# Patient Record
Sex: Male | Born: 1985
Health system: Southern US, Community
[De-identification: ages and names within clinical notes are randomized; demographics above are authoritative.]

## PROBLEM LIST (undated history)

## (undated) DIAGNOSIS — F418 Other specified anxiety disorders: Secondary | ICD-10-CM

## (undated) DIAGNOSIS — M25552 Pain in left hip: Secondary | ICD-10-CM

## (undated) DIAGNOSIS — F909 Attention-deficit hyperactivity disorder, unspecified type: Secondary | ICD-10-CM

## (undated) DIAGNOSIS — K219 Gastro-esophageal reflux disease without esophagitis: Secondary | ICD-10-CM

## (undated) DIAGNOSIS — F410 Panic disorder [episodic paroxysmal anxiety] without agoraphobia: Secondary | ICD-10-CM

## (undated) DIAGNOSIS — F32A Depression, unspecified: Secondary | ICD-10-CM

## (undated) DIAGNOSIS — F329 Major depressive disorder, single episode, unspecified: Secondary | ICD-10-CM

## (undated) HISTORY — DX: Pain in left hip: M25.552

## (undated) HISTORY — DX: Other specified anxiety disorders: F41.8

## (undated) HISTORY — DX: Major depressive disorder, single episode, unspecified: F32.9

## (undated) HISTORY — DX: Gastro-esophageal reflux disease without esophagitis: K21.9

## (undated) HISTORY — DX: Depression, unspecified: F32.A

## (undated) HISTORY — DX: Panic disorder (episodic paroxysmal anxiety): F41.0

## (undated) HISTORY — PX: WISDOM TOOTH EXTRACTION: SHX21

## (undated) HISTORY — DX: Attention-deficit hyperactivity disorder, unspecified type: F90.9

---

## 2006-06-06 ENCOUNTER — Ambulatory Visit (HOSPITAL_COMMUNITY): Admission: RE | Admit: 2006-06-06 | Discharge: 2006-06-06 | Payer: Self-pay | Admitting: Orthopaedic Surgery

## 2014-01-21 ENCOUNTER — Ambulatory Visit (INDEPENDENT_AMBULATORY_CARE_PROVIDER_SITE_OTHER): Payer: BC Managed Care – PPO | Admitting: Family Medicine

## 2014-01-21 ENCOUNTER — Encounter: Payer: Self-pay | Admitting: Family Medicine

## 2014-01-21 VITALS — BP 114/70 | HR 65 | Temp 99.0°F | Resp 18 | Ht 74.5 in | Wt 229.0 lb

## 2014-01-21 DIAGNOSIS — K219 Gastro-esophageal reflux disease without esophagitis: Secondary | ICD-10-CM | POA: Insufficient documentation

## 2014-01-21 DIAGNOSIS — F411 Generalized anxiety disorder: Secondary | ICD-10-CM | POA: Insufficient documentation

## 2014-01-21 DIAGNOSIS — F410 Panic disorder [episodic paroxysmal anxiety] without agoraphobia: Secondary | ICD-10-CM

## 2014-01-21 LAB — TSH: TSH: 1.41 u[IU]/mL (ref 0.35–4.50)

## 2014-01-21 MED ORDER — PANTOPRAZOLE SODIUM 40 MG PO TBEC: 40.0000 mg | DELAYED_RELEASE_TABLET | Freq: Every day | ORAL | Status: DC

## 2014-01-21 MED ORDER — LORAZEPAM 1 MG PO TABS: ORAL_TABLET | ORAL | Status: DC

## 2014-01-21 MED ORDER — CITALOPRAM HYDROBROMIDE 20 MG PO TABS
20.0000 mg | ORAL_TABLET | Freq: Every day | ORAL | Status: DC
Start: 2014-01-21 — End: 2014-07-15

## 2014-01-21 NOTE — Progress Notes (Addendum)
Office Note 02/06/2014  CC:  Chief Complaint  Patient presents with  . Establish Care  . Heartburn    for about a month  . Anxiety    unsure but possibly with new job    HPI:  Antonio MarinerRobert C James is a 28 y.o. White male who is here to establish care and discuss heartburn and anxiety. Patient's most recent primary MD: Dr. Lavada MesiMichael Hilts. Old records were not reviewed prior to or during today's visit.  Onset 1 mo ago intermittent heartburn, gradually worsening, constant in the last week or so--substernal burning that has now turned into a dull type stabbing feeling in center of sternum, feels like lump in upper esoph region, worse supine.  No dietary changes.  Not triggered by exertion. Prevacid OTC qd intermittently at first and now daily for the last week.  He notes no improvement. Says he is usually good at avoiding reflux-inducing foods.  No NSAIDs.   No upper abdomen abd discomfort.  Mild dysphagia related to feeling of lump in throat.  No coughing.  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  He has had episodes (always at night) recently of feeling his mind start racing, heart pounding, breaking out into sweat, face flushes, these episodes seem to come "out of the blue".  +Feeling of impending doom with these.  These sx's peak in 10-20 min and last hours sometimes.  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  Past Medical History  Diagnosis Date  . Depression     counseling throughout his life, no meds  . Left hip pain     Intermittent, has known torn labrum in left hip    Past Surgical History  Procedure Laterality Date  . Wisdom tooth extraction      No complications    Family History  Problem Relation Age of Onset  . Cancer Mother     breast  . Cancer Maternal Grandmother   . Heart disease Maternal Grandfather   . Stroke Maternal Grandfather   .  Diabetes Paternal Grandmother   . Heart attack Paternal Grandfather     History   Social History  . Marital Status: Married    Spouse Name: N/A    Number of Children: N/A  . Years of Education: N/A   Occupational History  . Not on file.   Social History Main Topics  . Smoking status: Former Smoker    Types: E-cigarettes  . Smokeless tobacco: Former NeurosurgeonUser  . Alcohol Use: Yes  . Drug Use: No  . Sexual Activity: Not on file   Other Topics Concern  . Not on file   Social History Narrative   Married, no children.   Lawyer: insurance and personal injury.   [place], [place].  Went to [employer], then [employer].   No Tob.  ETOH-social.     No drugs.   Exercise: lifts wts 3 x/week, runs 1-2 times per week.   MEDS:  Vit D daily. Glucos/chondroit daily Occasional Co-Q10  Allergies  Allergen Reactions  . Sulfa Antibiotics Hives and Rash    ROS Review of Systems  Constitutional: Negative for fever and fatigue.  HENT: Negative for congestion and sore throat.   Eyes: Negative for visual disturbance.  Respiratory: Negative for cough.   Cardiovascular: Negative for chest pain.  Gastrointestinal: Negative for nausea and  abdominal pain.  Genitourinary: Negative for dysuria.  Musculoskeletal: Negative for back pain and joint swelling.  Skin: Negative for rash.  Neurological: Negative for weakness and headaches.  Hematological: Negative for adenopathy.    PE; Blood pressure 114/70, pulse 65, temperature 99 F (37.2 C), temperature source Temporal, resp. rate 18, height 6' 2.5" (1.892 m), weight 229 lb (103.874 kg), SpO2 97.00%. Gen: Alert, well appearing.  Patient is oriented to person, place, time, and situation. ZDG:UYQIENT:Eyes: no injection, icteris, swelling, or exudate.  EOMI, PERRLA. Mouth: lips without lesion/swelling.  Oral mucosa pink and moist. Oropharynx without erythema, exudate, or swelling.  Neck - No masses or thyromegaly or limitation in range of motion CV:  RRR, no m/r/g.   LUNGS: CTA bilat, nonlabored resps, good aeration in all lung fields. ABD: soft, NT/ND EXT: no clubbing, cyanosis, or edema.   Pertinent labs:  None today  ASSESSMENT AND PLAN:   New pt: obtain old records.  1) GAD with panic attacks: start citalopram 20mg  qd.   Start ativan 1mg , 1-2 bid prn, particularly while waiting for citalopram to start helping, #60, RF x 1. Therapeutic expectations and side effect profile of medication discussed today.  Patient's questions answered. Check TSH today to r/o thyroid d/o as cause of his sx's.  2) GERD: pantoprazole 40mg  qAM.  Zantac 150mg  qhs.  GERD diet handout reviewed and given to pt today.    An After Visit Summary was printed and given to the patient.  Return in about 4 weeks (around 02/18/2014) for f/u anxiety and GERD.

## 2014-01-21 NOTE — Patient Instructions (Signed)
Buy OTC generic zantac 150mg  tabs and take one every night about 1 hour before bedtime.

## 2014-01-21 NOTE — Progress Notes (Signed)
Pre visit review using our clinic review tool, if applicable. No additional management support is needed unless otherwise documented below in the visit note. 

## 2014-02-06 DIAGNOSIS — F410 Panic disorder [episodic paroxysmal anxiety] without agoraphobia: Secondary | ICD-10-CM | POA: Insufficient documentation

## 2014-02-24 ENCOUNTER — Ambulatory Visit: Payer: BC Managed Care – PPO | Admitting: Family Medicine

## 2014-07-15 ENCOUNTER — Ambulatory Visit (INDEPENDENT_AMBULATORY_CARE_PROVIDER_SITE_OTHER): Payer: BC Managed Care – PPO | Admitting: Family Medicine

## 2014-07-15 ENCOUNTER — Encounter: Payer: Self-pay | Admitting: Family Medicine

## 2014-07-15 VITALS — BP 133/80 | HR 79 | Temp 98.8°F | Resp 18 | Ht 74.5 in | Wt 228.0 lb

## 2014-07-15 DIAGNOSIS — F900 Attention-deficit hyperactivity disorder, predominantly inattentive type: Secondary | ICD-10-CM

## 2014-07-15 DIAGNOSIS — K219 Gastro-esophageal reflux disease without esophagitis: Secondary | ICD-10-CM

## 2014-07-15 DIAGNOSIS — F418 Other specified anxiety disorders: Secondary | ICD-10-CM | POA: Insufficient documentation

## 2014-07-15 DIAGNOSIS — F909 Attention-deficit hyperactivity disorder, unspecified type: Secondary | ICD-10-CM | POA: Insufficient documentation

## 2014-07-15 DIAGNOSIS — F410 Panic disorder [episodic paroxysmal anxiety] without agoraphobia: Secondary | ICD-10-CM

## 2014-07-15 DIAGNOSIS — Z23 Encounter for immunization: Secondary | ICD-10-CM

## 2014-07-15 MED ORDER — AMPHETAMINE-DEXTROAMPHETAMINE 20 MG PO TABS: 20.0000 mg | ORAL_TABLET | Freq: Every day | ORAL | Status: DC

## 2014-07-15 MED ORDER — PANTOPRAZOLE SODIUM 40 MG PO TBEC: 40.0000 mg | DELAYED_RELEASE_TABLET | Freq: Every day | ORAL | Status: DC

## 2014-07-15 MED ORDER — LORAZEPAM 2 MG PO TABS: 2.0000 mg | ORAL_TABLET | Freq: Four times a day (QID) | ORAL | Status: DC | PRN

## 2014-07-15 NOTE — Progress Notes (Signed)
OFFICE NOTE  07/15/2014  CC:  Chief Complaint  Patient presents with  . Follow-up   HPI: Patient is a 28 y.o. Caucasian male who is here for f/u anxiety/panic attacks and GERD. I saw him and started meds for these problems about 6 mo ago and he missed the f/u we had planned for 1 mo later.  GERD is under good control on pantoprazole. Citalopram made him feel careless/lethargic so he stopped it after a couple of weeks. He finds 2 ativan helpful when stress/anxiety is the worst.  His anxiety is primarily situational.  He is not over-taking this med. Denies depressed mood. No time for consistent exercise. Still in stressful job, looking for new one.  Wants to get back on adderall, says he was on it most of his life and has tried to be off of it lately but finds his focus a big problem.  Hyperactivity was never a problem. He was last on adderall XR 20mg  qd and doesn't recall how long it lasted for him.  Pertinent PMH:  Past medical, surgical, social, and family history reviewed and no changes are noted since last office visit.  MEDS: Not taking celexa listed below Outpatient Prescriptions Prior to Visit  Medication Sig Dispense Refill  . Fish Oil-Cholecalciferol (FISH OIL + D3 PO) Take by mouth.    Marland Kitchen. LORazepam (ATIVAN) 1 MG tablet 1-2 tabs po bid prn anxiety 60 tablet 1  . Multiple Vitamin (MULTIVITAMIN) tablet Take 1 tablet by mouth daily.    . pantoprazole (PROTONIX) 40 MG tablet Take 1 tablet (40 mg total) by mouth daily. 30 tablet 3  . citalopram (CELEXA) 20 MG tablet Take 1 tablet (20 mg total) by mouth daily. 30 tablet 1   No facility-administered medications prior to visit.    PE: Blood pressure 133/80, pulse 79, temperature 98.8 F (37.1 C), temperature source Temporal, resp. rate 18, height 6' 2.5" (1.892 m), weight 228 lb (103.42 kg), SpO2 99 %. Wt Readings from Last 2 Encounters:  07/15/14 228 lb (103.42 kg)  01/21/14 229 lb (103.874 kg)    Gen: alert, oriented x 4,  affect pleasant.  Lucid thinking and conversation noted. HEENT: PERRLA, EOMI.   Neck: no LAD, mass, or thyromegaly. CV: RRR, no m/r/g LUNGS: CTA bilat, nonlabored. NEURO: no tremor or tics noted on observation.  Coordination intact. CN 2-12 grossly intact bilaterally, strength 5/5 in all extremeties.  No ataxia.   IMPRESSION AND PLAN:  1) Anxiety, situational.  Hx of panic: doing well on prn ativan: continue this at 2mg  bid prn, #60, RF x 1.  2) Adult ADD: restart adderall XR 20mg  qd, #30.  I printed rx's for adderall XR 30mg , #30 today for this month, December 2015, and January 2016.Marland Kitchen.  Appropriate fill on/after date was noted on each rx.  3) GERD: The current medical regimen is effective;  continue present plan and medications.  Flu vaccine IM today.  An After Visit Summary was printed and given to the patient.  FOLLOW UP: 3 mo

## 2014-07-15 NOTE — Progress Notes (Signed)
Pre visit review using our clinic review tool, if applicable. No additional management support is needed unless otherwise documented below in the visit note. 

## 2014-10-05 ENCOUNTER — Ambulatory Visit (INDEPENDENT_AMBULATORY_CARE_PROVIDER_SITE_OTHER): Payer: BLUE CROSS/BLUE SHIELD | Admitting: Family Medicine

## 2014-10-05 ENCOUNTER — Encounter: Payer: Self-pay | Admitting: Family Medicine

## 2014-10-05 VITALS — BP 115/75 | HR 66 | Temp 98.7°F | Resp 18 | Ht 74.5 in | Wt 227.0 lb

## 2014-10-05 DIAGNOSIS — F900 Attention-deficit hyperactivity disorder, predominantly inattentive type: Secondary | ICD-10-CM

## 2014-10-05 DIAGNOSIS — Z0000 Encounter for general adult medical examination without abnormal findings: Secondary | ICD-10-CM

## 2014-10-05 DIAGNOSIS — F909 Attention-deficit hyperactivity disorder, unspecified type: Secondary | ICD-10-CM

## 2014-10-05 DIAGNOSIS — F418 Other specified anxiety disorders: Secondary | ICD-10-CM

## 2014-10-05 DIAGNOSIS — Z23 Encounter for immunization: Secondary | ICD-10-CM

## 2014-10-05 MED ORDER — LISDEXAMFETAMINE DIMESYLATE 20 MG PO CAPS: 20.0000 mg | ORAL_CAPSULE | Freq: Every day | ORAL | Status: DC

## 2014-10-05 NOTE — Progress Notes (Signed)
Pre visit review using our clinic review tool, if applicable. No additional management support is needed unless otherwise documented below in the visit note. 

## 2014-10-05 NOTE — Progress Notes (Signed)
OFFICE NOTE  10/05/2014  CC:  Chief Complaint  Patient presents with  . Follow-up  . Headache    once adderall wears off.    HPI: Patient is a 29 y.o. Caucasian male who is here for 3 mo f/u adult ADD and situational anxiety. Says adderall helps, doesn't take it every day.  He says 20mg  dosing helped focus a lot but he often gets a HA and feels foggy in head when he is coming down.  He also tried to split pill and took 10mg  bid: this dose not as helpful but no HA as it came down. Still taking ativan prn, it helps at this dose.  Pertinent PMH:  Past medical, surgical, social, and family history reviewed and no changes are noted since last office visit.  MEDS:  Outpatient Prescriptions Prior to Visit  Medication Sig Dispense Refill  . amphetamine-dextroamphetamine (ADDERALL) 20 MG tablet Take 1 tablet (20 mg total) by mouth daily. 30 tablet 0  . Fish Oil-Cholecalciferol (FISH OIL + D3 PO) Take by mouth.    Marland Kitchen. LORazepam (ATIVAN) 2 MG tablet Take 1 tablet (2 mg total) by mouth every 6 (six) hours as needed for anxiety. 60 tablet 1  . Multiple Vitamin (MULTIVITAMIN) tablet Take 1 tablet by mouth daily.    . pantoprazole (PROTONIX) 40 MG tablet Take 1 tablet (40 mg total) by mouth daily. 30 tablet 6   No facility-administered medications prior to visit.    PE: Blood pressure 115/75, pulse 66, temperature 98.7 F (37.1 C), temperature source Temporal, resp. rate 18, height 6' 2.5" (1.892 m), weight 227 lb (102.967 kg), SpO2 97 %. Wt Readings from Last 2 Encounters:  10/05/14 227 lb (102.967 kg)  07/15/14 228 lb (103.42 kg)    Gen: alert, oriented x 4, affect pleasant.  Lucid thinking and conversation noted. HEENT: PERRLA, EOMI.   Neck: no LAD, mass, or thyromegaly. CV: RRR, no m/r/g LUNGS: CTA bilat, nonlabored. NEURO: no tremor or tics noted on observation.  Coordination intact. CN 2-12 grossly intact bilaterally, strength 5/5 in all extremeties.  No ataxia.   IMPRESSION AND  PLAN:  1) Adult ADD: d/c adderall short acting and change to vyvanse 20mg  qAM. Pt to adjust to 1 and 1/2 caps to 2 caps qAM if needed, depending on response. Call when finishing the 30 cap rx I gave him today to report how he's doing, any dose ajustment needed, any side effect, etc.  2) Situational anxiety: well controlled with prn use of ativan.  No new rx given for this med today.  3) Prev health care: Tdap IM given today.  An After Visit Summary was printed and given to the patient.  FOLLOW UP: 4 mo

## 2014-10-27 ENCOUNTER — Telehealth: Payer: Self-pay | Admitting: Family Medicine

## 2014-10-27 MED ORDER — LISDEXAMFETAMINE DIMESYLATE 40 MG PO CAPS: 40.0000 mg | ORAL_CAPSULE | ORAL | Status: DC

## 2014-10-27 NOTE — Telephone Encounter (Signed)
I printed TWO rx's for the 40mg  qd dosing, one for the next 1 month and then another for the month after this one.  F/u in office in 2 mo to f/u ADD.

## 2014-10-27 NOTE — Telephone Encounter (Signed)
Rx was forVyvanse 20MG . Dr. [facility]. The patient said the 30-40MG  is more effective. Patient would like to get [facility]ill [facility] [phone].

## 2014-10-27 NOTE — Telephone Encounter (Signed)
Patient aware Rx's are ready for p/u.

## 2014-10-28 ENCOUNTER — Telehealth: Payer: Self-pay | Admitting: Family Medicine

## 2014-10-28 MED ORDER — LORAZEPAM 2 MG PO TABS: 2.0000 mg | ORAL_TABLET | Freq: Four times a day (QID) | ORAL | Status: DC | PRN

## 2014-10-28 NOTE — Telephone Encounter (Signed)
rx pending signature and then will be faxed.

## 2014-10-28 NOTE — Telephone Encounter (Signed)
Ativan [facility]

## 2014-10-28 NOTE — Telephone Encounter (Signed)
Pt requesting rf of ativan.  Last Rx was 07/15/14 x 1 rf.  Please advise.

## 2014-10-28 NOTE — Telephone Encounter (Signed)
Lorazepam rx printed. 

## 2014-11-12 ENCOUNTER — Other Ambulatory Visit: Payer: Self-pay | Admitting: Orthopaedic Surgery

## 2014-11-12 DIAGNOSIS — M25512 Pain in left shoulder: Secondary | ICD-10-CM

## 2014-11-30 ENCOUNTER — Ambulatory Visit
Admission: RE | Admit: 2014-11-30 | Discharge: 2014-11-30 | Disposition: A | Discharge status: Home / Self Care | Payer: BLUE CROSS/BLUE SHIELD | Source: Ambulatory Visit | Attending: Orthopaedic Surgery | Admitting: Orthopaedic Surgery

## 2014-11-30 DIAGNOSIS — M25512 Pain in left shoulder: Secondary | ICD-10-CM

## 2014-11-30 MED ORDER — IOHEXOL 180 MG/ML  SOLN
14.0000 mL | Freq: Once | INTRAMUSCULAR | Status: AC | PRN
  Administered 2014-11-30: 14 mL via INTRA_ARTICULAR

## 2014-12-23 ENCOUNTER — Telehealth: Payer: Self-pay | Admitting: Family Medicine

## 2014-12-23 MED ORDER — LISDEXAMFETAMINE DIMESYLATE 40 MG PO CAPS: 40.0000 mg | ORAL_CAPSULE | ORAL | Status: DC

## 2014-12-23 NOTE — Telephone Encounter (Signed)
Last Rx for vyvanse was fill on or after 11/24/14.  Last OV was 10/05/14.  Please advise.

## 2014-12-23 NOTE — Telephone Encounter (Signed)
Patient is requesting Rx for Vyvanse. Please contact when ready to pu, his wife Wyn ForsterMadison will pu.

## 2014-12-23 NOTE — Telephone Encounter (Signed)
Pt aware rx is at front desk.  

## 2014-12-23 NOTE — Telephone Encounter (Signed)
OK, vyvanse rx printed.

## 2015-01-05 ENCOUNTER — Telehealth: Payer: Self-pay | Admitting: Family Medicine

## 2015-01-05 NOTE — Telephone Encounter (Signed)
OK with me.

## 2015-01-05 NOTE — Telephone Encounter (Signed)
[place] now [place].  He is requesting to transfer from [place] to [place].  Please advise.

## 2015-01-05 NOTE — Telephone Encounter (Signed)
yes

## 2015-01-06 NOTE — Telephone Encounter (Signed)
Patient will call back to establish

## 2015-01-28 ENCOUNTER — Other Ambulatory Visit: Payer: Self-pay | Admitting: *Deleted

## 2015-01-28 MED ORDER — PANTOPRAZOLE SODIUM 40 MG PO TBEC: 40.0000 mg | DELAYED_RELEASE_TABLET | Freq: Every day | ORAL | Status: DC

## 2015-01-28 MED ORDER — LISDEXAMFETAMINE DIMESYLATE 40 MG PO CAPS: 40.0000 mg | ORAL_CAPSULE | ORAL | Status: DC

## 2015-01-28 MED ORDER — LORAZEPAM 2 MG PO TABS: 2.0000 mg | ORAL_TABLET | Freq: Four times a day (QID) | ORAL | Status: DC | PRN

## 2015-01-28 NOTE — Telephone Encounter (Signed)
Pt called requesting refill for 1) Vyvanse (last written: 12/23/14 w/ Wende Crease0Rf), 2) Ativan (last written: 10/28/14 w/ 3RF). LOV: 10/27/14, up coming ov 02/02/15. Please advise. Thanks.

## 2015-01-28 NOTE — Telephone Encounter (Signed)
Left message for pt to call back  °

## 2015-01-28 NOTE — Telephone Encounter (Signed)
Will do one month supply of vyvanse and Ativan, then pt needs routine office f/u with his new PCP ([facility]) before any FURTHER rx's can be given.-thx

## 2015-01-28 NOTE — Telephone Encounter (Signed)
Pt called requesting refill for pantoprazole. LOV: 10/24/14, up coming ov: 02/02/15, last written: 07/15/14 w/ 6RF. Rx sent for #30 w/ 6Rf.

## 2015-02-01 NOTE — Telephone Encounter (Signed)
Pt advised and voiced understanding. He stated that he called [facility] and they told him the Dr. Yetta James does not do primary care. He stated that he will try calling them again but if he is unable to see Dr. Yetta James he will keep Dr. Milinda James as his PCP.

## 2015-02-01 NOTE — Telephone Encounter (Signed)
Noted  

## 2015-02-02 ENCOUNTER — Ambulatory Visit: Payer: Self-pay | Admitting: Family Medicine

## 2015-02-14 ENCOUNTER — Ambulatory Visit: Payer: BLUE CROSS/BLUE SHIELD | Admitting: Internal Medicine

## 2015-02-16 ENCOUNTER — Other Ambulatory Visit (INDEPENDENT_AMBULATORY_CARE_PROVIDER_SITE_OTHER): Payer: BLUE CROSS/BLUE SHIELD

## 2015-02-16 ENCOUNTER — Ambulatory Visit (INDEPENDENT_AMBULATORY_CARE_PROVIDER_SITE_OTHER): Payer: BLUE CROSS/BLUE SHIELD | Admitting: Internal Medicine

## 2015-02-16 VITALS — BP 106/64 | HR 78 | Temp 98.2°F | Resp 16 | Ht 74.5 in | Wt 213.0 lb

## 2015-02-16 DIAGNOSIS — R21 Rash and other nonspecific skin eruption: Secondary | ICD-10-CM

## 2015-02-16 DIAGNOSIS — F410 Panic disorder [episodic paroxysmal anxiety] without agoraphobia: Secondary | ICD-10-CM

## 2015-02-16 DIAGNOSIS — F900 Attention-deficit hyperactivity disorder, predominantly inattentive type: Secondary | ICD-10-CM | POA: Diagnosis not present

## 2015-02-16 DIAGNOSIS — E291 Testicular hypofunction: Secondary | ICD-10-CM

## 2015-02-16 DIAGNOSIS — Z0000 Encounter for general adult medical examination without abnormal findings: Secondary | ICD-10-CM

## 2015-02-16 DIAGNOSIS — F909 Attention-deficit hyperactivity disorder, unspecified type: Secondary | ICD-10-CM

## 2015-02-16 LAB — COMPREHENSIVE METABOLIC PANEL
ALK PHOS: 56 U/L (ref 39–117)
ALT: 31 U/L (ref 0–53)
AST: 24 U/L (ref 0–37)
Albumin: 4.6 g/dL (ref 3.5–5.2)
BILIRUBIN TOTAL: 0.7 mg/dL (ref 0.2–1.2)
BUN: 25 mg/dL — AB (ref 6–23)
CALCIUM: 9.4 mg/dL (ref 8.4–10.5)
CO2: 28 meq/L (ref 19–32)
Chloride: 103 mEq/L (ref 96–112)
Creatinine, Ser: 1.06 mg/dL (ref 0.40–1.50)
GFR: 87.39 mL/min (ref >60.00)
GLUCOSE: 89 mg/dL (ref 70–99)
POTASSIUM: 3.9 meq/L (ref 3.5–5.1)
Sodium: 138 mEq/L (ref 135–145)
Total Protein: 7 g/dL (ref 6.0–8.3)

## 2015-02-16 LAB — CBC WITH DIFFERENTIAL/PLATELET
Basophils Absolute: 0 10*3/uL (ref 0.0–0.1)
Basophils Relative: 0.5 % (ref 0.0–3.0)
Eosinophils Absolute: 0 10*3/uL (ref 0.0–0.7)
Eosinophils Relative: 0.4 % (ref 0.0–5.0)
HCT: 47.4 % (ref 39.0–52.0)
Hemoglobin: 16 g/dL (ref 13.0–17.0)
LYMPHS PCT: 30 % (ref 12.0–46.0)
Lymphs Abs: 1.6 10*3/uL (ref 0.7–4.0)
MCHC: 33.7 g/dL (ref 30.0–36.0)
MCV: 88.4 fl (ref 78.0–100.0)
MONO ABS: 0.4 10*3/uL (ref 0.1–1.0)
Monocytes Relative: 7.3 % (ref 3.0–12.0)
NEUTROS PCT: 61.8 % (ref 43.0–77.0)
Neutro Abs: 3.3 10*3/uL (ref 1.4–7.7)
PLATELETS: 219 10*3/uL (ref 150.0–400.0)
RBC: 5.36 Mil/uL (ref 4.22–5.81)
RDW: 13.6 % (ref 11.5–15.5)
WBC: 5.3 10*3/uL (ref 4.0–10.5)

## 2015-02-16 LAB — LIPID PANEL
CHOL/HDL RATIO: 3
Cholesterol: 168 mg/dL (ref 0–200)
HDL: 49.7 mg/dL (ref >39.00)
LDL Cholesterol: 106 mg/dL — ABNORMAL HIGH (ref 0–99)
NONHDL: 118.3
Triglycerides: 62 mg/dL (ref 0.0–149.0)
VLDL: 12.4 mg/dL (ref 0.0–40.0)

## 2015-02-16 LAB — TSH: TSH: 1.53 u[IU]/mL (ref 0.35–4.50)

## 2015-02-16 MED ORDER — LISDEXAMFETAMINE DIMESYLATE 40 MG PO CAPS: 40.0000 mg | ORAL_CAPSULE | ORAL | Status: DC

## 2015-02-16 NOTE — Progress Notes (Signed)
Pre visit review using our clinic review tool, if applicable. No additional management support is needed unless otherwise documented below in the visit note. 

## 2015-02-16 NOTE — Patient Instructions (Signed)

## 2015-02-17 ENCOUNTER — Encounter: Payer: Self-pay | Admitting: Internal Medicine

## 2015-02-17 LAB — TESTOSTERONE, FREE, TOTAL, SHBG
SEX HORMONE BINDING: 67 nmol/L — AB (ref 10–50)
TESTOSTERONE: 321 ng/dL (ref 300–890)
Testosterone, Free: 38.9 pg/mL — ABNORMAL LOW (ref 47.0–244.0)
Testosterone-% Free: 1.2 % — ABNORMAL LOW (ref 1.6–2.9)

## 2015-02-17 NOTE — Progress Notes (Signed)
Subjective:  Patient ID: Antonio James, male    DOB: 05-Mar-1986  Age: 29 y.o. MRN: [id number]  CC: ADHD  New to me   HPI [name] presents for follow up on ADHD, panic attacks, and history of low testosterone level. He complains of low libido and ED.  Pt states ADD status overall stable on current meds with overall good compliance and tolerability, and good effectiveness with respect to ability for concentration and task completion.  Outpatient Prescriptions Prior to Visit  Medication Sig Dispense Refill  . Fish Oil-Cholecalciferol (FISH OIL + D3 PO) Take by mouth.    Marland Kitchen LORazepam (ATIVAN) 2 MG tablet Take 1 tablet (2 mg total) by mouth every 6 (six) hours as needed for anxiety. 60 tablet 0  . Multiple Vitamin (MULTIVITAMIN) tablet Take 1 tablet by mouth daily.    . pantoprazole (PROTONIX) 40 MG tablet Take 1 tablet (40 mg total) by mouth daily. 30 tablet 6  . lisdexamfetamine (VYVANSE) 40 MG capsule Take 1 capsule (40 mg total) by mouth every morning. 30 capsule 0   No facility-administered medications prior to visit.    ROS Review of Systems  Constitutional: Negative.  Negative for fever, chills, diaphoresis, appetite change and fatigue.  HENT: Negative.   Eyes: Negative.   Respiratory: Negative.  Negative for cough, choking, chest tightness, shortness of breath and stridor.   Cardiovascular: Negative.  Negative for chest pain, palpitations and leg swelling.  Gastrointestinal: Negative.  Negative for nausea, vomiting, abdominal pain, diarrhea, constipation and blood in stool.  Endocrine: Negative.   Genitourinary: Negative.   Skin: Positive for rash. Negative for color change, pallor and wound.       He had a fleeting red rash on his torso a few weeks ago and wants to be tested for Lyme  Allergic/Immunologic: Negative.   Neurological: Negative.  Negative for dizziness, syncope, weakness and light-headedness.  Hematological: Negative.  Negative for adenopathy. Does not  bruise/bleed easily.  Psychiatric/Behavioral: Positive for sleep disturbance and decreased concentration. Negative for suicidal ideas, hallucinations, behavioral problems, confusion, self-injury, dysphoric mood and agitation. The patient is nervous/anxious. The patient is not hyperactive.     Objective:  BP 106/64 mmHg  Pulse 78  Temp(Src) 98.2 F (36.8 C) (Oral)  Resp 16  Ht 6' 2.5" (1.892 m)  Wt 213 lb (96.616 kg)  BMI 26.99 kg/m2  SpO2 96%  BP Readings from Last 3 Encounters:  02/16/15 106/64  10/05/14 115/75  07/15/14 133/80    Wt Readings from Last 3 Encounters:  02/16/15 213 lb (96.616 kg)  10/05/14 227 lb (102.967 kg)  07/15/14 228 lb (103.42 kg)    Physical Exam  Constitutional: He is oriented to person, place, and time. He appears well-developed and well-nourished. No distress.  HENT:  Head: Normocephalic and atraumatic.  Mouth/Throat: Oropharynx is clear and moist. No oropharyngeal exudate.  Eyes: Conjunctivae are normal. Left eye exhibits no discharge. No scleral icterus.  Neck: Normal range of motion. Neck supple. No JVD present. No tracheal deviation present. No thyromegaly present.  Cardiovascular: Normal rate, regular rhythm, normal heart sounds and intact distal pulses.  Exam reveals no gallop and no friction rub.   No murmur heard. Pulmonary/Chest: Effort normal and breath sounds normal. No stridor. No respiratory distress. He has no wheezes. He has no rales. He exhibits no tenderness.  Abdominal: Soft. Bowel sounds are normal. He exhibits no distension and no mass. There is no tenderness. There is no rebound and no guarding.  Musculoskeletal: Normal range of motion. He exhibits no edema or tenderness.  Lymphadenopathy:    He has no cervical adenopathy.  Neurological: He is oriented to person, place, and time.  Skin: Skin is warm and dry. No rash noted. He is not diaphoretic. No erythema. No pallor.  Psychiatric: His behavior is normal. Judgment and thought  content normal. His mood appears not anxious. His affect is not angry, not blunt, not labile and not inappropriate. His speech is not rapid and/or pressured, not delayed and not tangential. He is not aggressive, not hyperactive, not slowed, not withdrawn, not actively hallucinating and not combative. Cognition and memory are normal. He does not exhibit a depressed mood. He expresses no homicidal and no suicidal ideation. He expresses no suicidal plans and no homicidal plans. He is attentive.  Vitals reviewed.   Lab Results  Component Value Date   WBC 5.3 02/16/2015   HGB 16.0 02/16/2015   HCT 47.4 02/16/2015   PLT 219.0 02/16/2015   GLUCOSE 89 02/16/2015   CHOL 168 02/16/2015   TRIG 62.0 02/16/2015   HDL 49.70 02/16/2015   LDLCALC 106* 02/16/2015   ALT 31 02/16/2015   AST 24 02/16/2015   NA 138 02/16/2015   K 3.9 02/16/2015   CL 103 02/16/2015   CREATININE 1.06 02/16/2015   BUN 25* 02/16/2015   CO2 28 02/16/2015   TSH 1.53 02/16/2015    Mr Shoulder Left W Contrast  11/30/2014   CLINICAL DATA:  Left shoulder pain with exercise for approximately 6 months. No specific injury or prior relevant surgery. Initial encounter.  EXAM: MR ARTHROGRAM OF THE LEFT SHOULDER  TECHNIQUE: Multiplanar, multisequence MR imaging of the left shoulder was performed following the administration of intra-articular contrast.  CONTRAST:  See Injection Documentation.  COMPARISON:  Left shoulder MR arthrogram 06/06/2006. Injection image today.  FINDINGS: Glenohumeral Joint: Adequately distended with contrast.No extra-articular contrast extension demonstrated.No chondral defect or loose body observed.  Labrum: No evidence of labral tear.The glenohumeral ligaments appear intact.  Biceps long head:  Intact and normally positioned.  Acromioclavicular Joint: The acromion is type 1. There are mild acromioclavicular degenerative changes. No significant fluid is present in the subacromial - subdeltoid bursa.  Rotator cuff:  Intact without significant tendinosis. Mild heterogeneity within the superior subscapularis recess attributed to the injection.  Muscles:  No focal muscular atrophy or edema.  Bones:  No significant extra-articular osseous findings.  IMPRESSION: No acute findings or significant changes. The rotator cuff and labrum appear intact.   Electronically Signed   By: Carey Bullocks M.D.   On: 11/30/2014 16:54   Dg Fluoro Guide Ndl Plc/bx  11/30/2014   CLINICAL DATA:  Left shoulder pain.  FLUOROSCOPY TIME:  Radiation Exposure Index (as provided by the fluoroscopic device): 40.68 microGray*m2  Fluoroscopy Time (in minutes and seconds):  0 minutes 18 seconds  PROCEDURE: LEFT SHOULDER INJECTION UNDER FLUOROSCOPY  An appropriate skin entrance site was determined. The site was marked, prepped with Betadine, draped in the usual sterile fashion, and infiltrated locally with buffered Lidocaine. 22 gauge spinal needle was advanced to the superomedial margin of the humeral head under intermittent fluoroscopy. 1 ml of Lidocaine injected easily. A mixture of 0.1 mL of Multihance, 5 mL of 1% lidocaine, and 15 mL of Omnipaque 180 was then used to opacify the left shoulder capsule. No immediate complication.  IMPRESSION: Technically successful left shoulder injection for MRI.   Electronically Signed   By: Sebastian Ache   On: 11/30/2014 15:37  Assessment & Plan:   Ohn was seen today for adhd.  Diagnoses and all orders for this visit:  Panic attacks - he rarely takes ativan and is does not appear that he has had any issues with dependence or abuse, will cont this for now at a low dose, he is not willing to take an SSRI  Adult ADHD - will cont vyvanse Orders: -     Discontinue: lisdexamfetamine (VYVANSE) 40 MG capsule; Take 1 capsule (40 mg total) by mouth every morning. -     Discontinue: lisdexamfetamine (VYVANSE) 40 MG capsule; Take 1 capsule (40 mg total) by mouth every morning. -     Discontinue: lisdexamfetamine  (VYVANSE) 40 MG capsule; Take 1 capsule (40 mg total) by mouth every morning. -     lisdexamfetamine (VYVANSE) 40 MG capsule; Take 1 capsule (40 mg total) by mouth every morning.  Routine general medical examination at a health care facility - exam done, vaccines were reviewed and updated, labs ordered, pt ed material was given Orders: -     Lipid panel; Future -     Comprehensive metabolic panel; Future -     CBC with Differential/Platelet; Future -     TSH; Future  Hypogonadism male - will check his T level and will advise further Orders: -     Testosterone, Free, Total, SHBG; Future  Rash and nonspecific skin eruption Orders: -     B. burgdorfi antibodies by WB; Future   I am having Mr. Nephew maintain his multivitamin, Fish Oil-Cholecalciferol (FISH OIL + D3 PO), pantoprazole, LORazepam, and lisdexamfetamine.  Meds ordered this encounter  Medications  . DISCONTD: lisdexamfetamine (VYVANSE) 40 MG capsule    Sig: Take 1 capsule (40 mg total) by mouth every morning.    Dispense:  30 capsule    Refill:  0    Fill on or after 02/16/15  . DISCONTD: lisdexamfetamine (VYVANSE) 40 MG capsule    Sig: Take 1 capsule (40 mg total) by mouth every morning.    Dispense:  30 capsule    Refill:  0    Fill on or after 03/18/15  . DISCONTD: lisdexamfetamine (VYVANSE) 40 MG capsule    Sig: Take 1 capsule (40 mg total) by mouth every morning.    Dispense:  30 capsule    Refill:  0    Fill on or after 04/18/15  . lisdexamfetamine (VYVANSE) 40 MG capsule    Sig: Take 1 capsule (40 mg total) by mouth every morning.    Dispense:  30 capsule    Refill:  0    Fill on or after 05/19/15     Follow-up: Return in about 4 months (around 06/18/2015).  Sanda Linger, MD

## 2015-02-22 ENCOUNTER — Encounter: Payer: Self-pay | Admitting: Internal Medicine

## 2015-02-23 ENCOUNTER — Encounter: Payer: Self-pay | Admitting: Internal Medicine

## 2015-02-23 LAB — B. BURGDORFI ANTIBODIES BY WB
B BURGDORFERI IGG ABS (IB): NEGATIVE
B burgdorferi IgM Abs (IB): NEGATIVE

## 2015-02-24 ENCOUNTER — Other Ambulatory Visit: Payer: Self-pay | Admitting: Internal Medicine

## 2015-02-24 DIAGNOSIS — E291 Testicular hypofunction: Secondary | ICD-10-CM

## 2015-03-22 ENCOUNTER — Ambulatory Visit (INDEPENDENT_AMBULATORY_CARE_PROVIDER_SITE_OTHER): Payer: BLUE CROSS/BLUE SHIELD | Admitting: Endocrinology

## 2015-03-22 ENCOUNTER — Encounter: Payer: Self-pay | Admitting: Endocrinology

## 2015-03-22 VITALS — BP 128/84 | HR 64 | Temp 97.4°F | Ht 74.5 in | Wt 209.0 lb

## 2015-03-22 DIAGNOSIS — E291 Testicular hypofunction: Secondary | ICD-10-CM

## 2015-03-22 MED ORDER — CLOMIPHENE CITRATE 50 MG PO TABS: ORAL_TABLET | ORAL | Status: DC

## 2015-03-22 NOTE — Patient Instructions (Signed)
i have sent a prescription to your pharmacy, for the testosterone normalization of testosterone is not known to harm you.  however, there are "theoretical" risks, including increased fertility, hair loss, prostate cancer, benign prostate enlargement, blood clots, liver problems, lower hdl ("good cholesterol"), polycythemia (opposite of anemia), sleep apnea, and behavior changes It is especially important to be aware of the increased fertility on this medication Please recheck the blood test in 1 month. Please come back for a follow-up appointment in 6 months.

## 2015-03-22 NOTE — Progress Notes (Signed)
Subjective:    Patient ID: Antonio MarinerRobert C James, male    DOB: 08/06/1986, 29 y.o.   MRN: [id number]  HPI Pt reports he had puberty at the normal age.  He has no biological children, but would like to start a family.  He says he has never taken illicit androgens.  He does not take antiandrogens or opioids.  He denies any h/o XRT, or genital infection.  He has never had surgery, or a serious injury to the head or genital area.  He does not consume alcohol excessively.  Pt states few years of slight swelling at the breast areas, and assoc fatigue.  He took androgel x a few months, but did not like that it was messy, and reduced fertility.   Past Medical History  Diagnosis Date  . Depression     counseling throughout his life, no meds  . Left hip pain     Intermittent, has known torn labrum in left hip  . Panic attacks   . GERD (gastroesophageal reflux disease)   . Situational anxiety   . Adult ADHD     Past Surgical History  Procedure Laterality Date  . Wisdom tooth extraction      No complications    History   Social History  . Marital Status: Married    Spouse Name: N/A  . Number of Children: N/A  . Years of Education: N/A   Occupational History  . Not on file.   Social History Main Topics  . Smoking status: Former Smoker    Types: E-cigarettes  . Smokeless tobacco: Former NeurosurgeonUser  . Alcohol Use: Yes  . Drug Use: No  . Sexual Activity: Not on file   Other Topics Concern  . Not on file   Social History Narrative   Married, no children.   Lawyer: insurance and personal injury.   [place].  Went to [employer] [place], then [employer].   No Tob.  ETOH-social.     No drugs.   Exercise: lifts wts 3 x/week, runs 1-2 times per week.    Current Outpatient Prescriptions on File Prior to Visit  Medication Sig Dispense Refill  . Fish Oil-Cholecalciferol (FISH OIL + D3 PO) Take by mouth.    . lisdexamfetamine (VYVANSE) 40 MG capsule Take 1 capsule (40 mg total) by mouth  every morning. 30 capsule 0  . LORazepam (ATIVAN) 2 MG tablet Take 1 tablet (2 mg total) by mouth every 6 (six) hours as needed for anxiety. 60 tablet 0  . Multiple Vitamin (MULTIVITAMIN) tablet Take 1 tablet by mouth daily.    . pantoprazole (PROTONIX) 40 MG tablet Take 1 tablet (40 mg total) by mouth daily. 30 tablet 6   No current facility-administered medications on file prior to visit.    Allergies  Allergen Reactions  . Sulfa Antibiotics Hives and Rash    Family History  Problem Relation Age of Onset  . Cancer Mother     breast  . Cancer Maternal Grandmother   . Heart disease Maternal Grandfather   . Stroke Maternal Grandfather   . Diabetes Paternal Grandmother   . Heart attack Paternal Grandfather   . Other Paternal Grandfather     hypogonadism    BP 128/84 mmHg  Pulse 64  Temp(Src) 97.4 F (36.3 C) (Oral)  Ht 6' 2.5" (1.892 m)  Wt 209 lb (94.802 kg)  BMI 26.48 kg/m2  SpO2 97%  Review of Systems denies numbness, weight change, decreased urinary stream, muscle  weakness, fever, headache, easy bruising, sob, rash, blurry vision, rhinorrhea, chest pain.  He reports insomnia, ED sxs, and decreased libido.       Objective:   Physical Exam VS: see vs page. GEN: no distress.  HEAD: head: no deformity eyes: no periorbital swelling, no proptosis external nose and ears are normal mouth: no lesion seen NECK: supple, thyroid is not enlarged CHEST WALL: no deformity.  No gynecomastia.   LUNGS: clear to auscultation. CV: reg rate and rhythm, no murmur ABD: abdomen is soft, nontender.  no hepatosplenomegaly.  not distended.  no hernia MUSCULOSKELETAL: muscle bulk and strength are grossly normal.  no obvious joint swelling.  gait is normal and steady.  EXTEMITIES: no deformity.  no edema.   NEURO:  cn 2-12 grossly intact.   readily moves all 4's.  sensation is intact to touch on the feet.  SKIN:  Normal texture and temperature.  No rash or suspicious lesion is visible.   Normal hair distribution.   NODES:  None palpable at the neck.   PSYCH: alert, well-oriented.  Does not appear anxious nor depressed.     outside test results are reviewed: (05/23/10): testosterone=243 Prolactin=10 TSH=2.7 Lab Results  Component Value Date   TESTOSTERONE 321 02/16/2015   i have review note from Dr Yetta Barre: ADD status was noted to be overall stable, and this did not seem to be the cause of the ED sxs.    Assessment & Plan:  Idiopathic central hypogonadism, new to me  Patient is advised the following: Patient Instructions  i have sent a prescription to your pharmacy, for the testosterone normalization of testosterone is not known to harm you.  however, there are "theoretical" risks, including increased fertility, hair loss, prostate cancer, benign prostate enlargement, blood clots, liver problems, lower hdl ("good cholesterol"), polycythemia (opposite of anemia), sleep apnea, and behavior changes It is especially important to be aware of the increased fertility on this medication Please recheck the blood test in 1 month. Please come back for a follow-up appointment in 6 months.

## 2015-04-22 ENCOUNTER — Other Ambulatory Visit (INDEPENDENT_AMBULATORY_CARE_PROVIDER_SITE_OTHER): Payer: BLUE CROSS/BLUE SHIELD

## 2015-04-22 DIAGNOSIS — E291 Testicular hypofunction: Secondary | ICD-10-CM

## 2015-04-22 LAB — CBC WITH DIFFERENTIAL/PLATELET
Basophils Absolute: 0 10*3/uL (ref 0.0–0.1)
Basophils Relative: 0.8 % (ref 0.0–3.0)
Eosinophils Absolute: 0.1 10*3/uL (ref 0.0–0.7)
Eosinophils Relative: 1.8 % (ref 0.0–5.0)
HCT: 45.1 % (ref 39.0–52.0)
Hemoglobin: 15.5 g/dL (ref 13.0–17.0)
LYMPHS ABS: 1.5 10*3/uL (ref 0.7–4.0)
Lymphocytes Relative: 42.5 % (ref 12.0–46.0)
MCHC: 34.5 g/dL (ref 30.0–36.0)
MCV: 88.7 fl (ref 78.0–100.0)
MONO ABS: 0.4 10*3/uL (ref 0.1–1.0)
Monocytes Relative: 9.8 % (ref 3.0–12.0)
NEUTROS ABS: 1.6 10*3/uL (ref 1.4–7.7)
Neutrophils Relative %: 45.1 % (ref 43.0–77.0)
PLATELETS: 161 10*3/uL (ref 150.0–400.0)
RBC: 5.08 Mil/uL (ref 4.22–5.81)
RDW: 13.7 % (ref 11.5–15.5)
WBC: 3.6 10*3/uL — ABNORMAL LOW (ref 4.0–10.5)

## 2015-04-25 LAB — TESTOSTERONE,FREE AND TOTAL
TESTOSTERONE: 880 ng/dL (ref 348–1197)
Testosterone, Free: 13.4 pg/mL (ref 9.3–26.5)

## 2015-08-02 ENCOUNTER — Other Ambulatory Visit: Payer: Self-pay | Admitting: Internal Medicine

## 2015-08-08 ENCOUNTER — Other Ambulatory Visit (INDEPENDENT_AMBULATORY_CARE_PROVIDER_SITE_OTHER): Payer: BLUE CROSS/BLUE SHIELD

## 2015-08-08 ENCOUNTER — Ambulatory Visit (INDEPENDENT_AMBULATORY_CARE_PROVIDER_SITE_OTHER): Payer: BLUE CROSS/BLUE SHIELD | Admitting: Internal Medicine

## 2015-08-08 ENCOUNTER — Encounter: Payer: Self-pay | Admitting: Internal Medicine

## 2015-08-08 VITALS — BP 124/78 | HR 68 | Temp 98.7°F | Resp 20 | Ht 75.0 in | Wt 214.0 lb

## 2015-08-08 DIAGNOSIS — E291 Testicular hypofunction: Secondary | ICD-10-CM

## 2015-08-08 DIAGNOSIS — Z23 Encounter for immunization: Secondary | ICD-10-CM | POA: Diagnosis not present

## 2015-08-08 DIAGNOSIS — F900 Attention-deficit hyperactivity disorder, predominantly inattentive type: Secondary | ICD-10-CM

## 2015-08-08 DIAGNOSIS — F909 Attention-deficit hyperactivity disorder, unspecified type: Secondary | ICD-10-CM

## 2015-08-08 LAB — CBC WITH DIFFERENTIAL/PLATELET
BASOS ABS: 0 10*3/uL (ref 0.0–0.1)
BASOS PCT: 0.2 % (ref 0.0–3.0)
EOS ABS: 0 10*3/uL (ref 0.0–0.7)
Eosinophils Relative: 0.5 % (ref 0.0–5.0)
HEMATOCRIT: 48.1 % (ref 39.0–52.0)
Hemoglobin: 16.2 g/dL (ref 13.0–17.0)
LYMPHS ABS: 0.7 10*3/uL (ref 0.7–4.0)
LYMPHS PCT: 13.4 % (ref 12.0–46.0)
MCHC: 33.6 g/dL (ref 30.0–36.0)
MCV: 88.6 fl (ref 78.0–100.0)
MONO ABS: 0.4 10*3/uL (ref 0.1–1.0)
Monocytes Relative: 7.9 % (ref 3.0–12.0)
NEUTROS ABS: 4.3 10*3/uL (ref 1.4–7.7)
NEUTROS PCT: 78 % — AB (ref 43.0–77.0)
PLATELETS: 140 10*3/uL — AB (ref 150.0–400.0)
RBC: 5.43 Mil/uL (ref 4.22–5.81)
RDW: 13.1 % (ref 11.5–15.5)
WBC: 5.5 10*3/uL (ref 4.0–10.5)

## 2015-08-08 LAB — COMPREHENSIVE METABOLIC PANEL
ALT: 26 U/L (ref 0–53)
AST: 26 U/L (ref 0–37)
Albumin: 4.3 g/dL (ref 3.5–5.2)
Alkaline Phosphatase: 55 U/L (ref 39–117)
BILIRUBIN TOTAL: 0.8 mg/dL (ref 0.2–1.2)
BUN: 23 mg/dL (ref 6–23)
CALCIUM: 9.2 mg/dL (ref 8.4–10.5)
CHLORIDE: 103 meq/L (ref 96–112)
CO2: 30 meq/L (ref 19–32)
CREATININE: 1.18 mg/dL (ref 0.40–1.50)
GFR: 76.97 mL/min (ref >60.00)
GLUCOSE: 70 mg/dL (ref 70–99)
Potassium: 4 mEq/L (ref 3.5–5.1)
SODIUM: 140 meq/L (ref 135–145)
Total Protein: 6.9 g/dL (ref 6.0–8.3)

## 2015-08-08 MED ORDER — LISDEXAMFETAMINE DIMESYLATE 40 MG PO CAPS: 40.0000 mg | ORAL_CAPSULE | ORAL | Status: DC

## 2015-08-08 NOTE — Progress Notes (Signed)
Pre visit review using our clinic review tool, if applicable. No additional management support is needed unless otherwise documented below in the visit note. 

## 2015-08-08 NOTE — Patient Instructions (Signed)
Attention Deficit Hyperactivity Disorder  Attention deficit hyperactivity disorder (ADHD) is a problem with behavior issues based on the way the brain functions (neurobehavioral disorder). It is a common reason for behavior and academic problems in school.  SYMPTOMS   There are 3 types of ADHD. The 3 types and some of the symptoms include:  · Inattentive.    Gets bored or distracted easily.    Loses or forgets things. Forgets to hand in homework.    Has trouble organizing or completing tasks.    Difficulty staying on task.    An inability to organize daily tasks and school work.    Leaving projects, chores, or homework unfinished.    Trouble paying attention or responding to details. Careless mistakes.    Difficulty following directions. Often seems like is not listening.    Dislikes activities that require sustained attention (like chores or homework).  · Hyperactive-impulsive.    Feels like it is impossible to sit still or stay in a seat. Fidgeting with hands and feet.    Trouble waiting turn.    Talking too much or out of turn. Interruptive.    Speaks or acts impulsively.    Aggressive, disruptive behavior.    Constantly busy or on the go; noisy.    Often leaves seat when they are expected to remain seated.    Often runs or climbs where it is not appropriate, or feels very restless.  · Combined.    Has symptoms of both of the above.  Often children with ADHD feel discouraged about themselves and with school. They often perform well below their abilities in school.  As children get older, the excess motor activities can calm down, but the problems with paying attention and staying organized persist. Most children do not outgrow ADHD but with good treatment can learn to cope with the symptoms.  DIAGNOSIS   When ADHD is suspected, the diagnosis should be made by professionals trained in ADHD. This professional will collect information about the individual suspected of having ADHD. Information must be collected from  various settings where the person lives, works, or attends school.    Diagnosis will include:  · Confirming symptoms began in childhood.  · Ruling out other reasons for the child's behavior.  · The health care providers will check with the child's school and check their medical records.  · They will talk to teachers and parents.  · Behavior rating scales for the child will be filled out by those dealing with the child on a daily basis.  A diagnosis is made only after all information has been considered.  TREATMENT   Treatment usually includes behavioral treatment, tutoring or extra support in school, and stimulant medicines. Because of the way a person's brain works with ADHD, these medicines decrease impulsivity and hyperactivity and increase attention. This is different than how they would work in a person who does not have ADHD. Other medicines used include antidepressants and certain blood pressure medicines.  Most experts agree that treatment for ADHD should address all aspects of the person's functioning. Along with medicines, treatment should include structured classroom management at school. Parents should reward good behavior, provide constant discipline, and set limits. Tutoring should be available for the child as needed.  ADHD is a lifelong condition. If untreated, the disorder can have long-term serious effects into adolescence and adulthood.  HOME CARE INSTRUCTIONS   · Often with ADHD there is a lot of frustration among family members dealing with the condition. Blame   and anger are also feelings that are common. In many cases, because the problem affects the family as a whole, the entire family may need help. A therapist can help the family find better ways to handle the disruptive behaviors of the person with ADHD and promote change. If the person with ADHD is young, most of the therapist's work is with the parents. Parents will learn techniques for coping with and improving their child's behavior.  Sometimes only the child with the ADHD needs counseling. Your health care providers can help you make these decisions.  · Children with ADHD may need help learning how to organize. Some helpful tips include:  ¨ Keep routines the same every day from wake-up time to bedtime. Schedule all activities, including homework and playtime. Keep the schedule in a place where the person with ADHD will often see it. Mark schedule changes as far in advance as possible.  ¨ Schedule outdoor and indoor recreation.  ¨ Have a place for everything and keep everything in its place. This includes clothing, backpacks, and school supplies.  ¨ Encourage writing down assignments and bringing home needed books. Work with your child's teachers for assistance in organizing school work.  · Offer your child a well-balanced diet. Breakfast that includes a balance of whole grains, protein, and fruits or vegetables is especially important for school performance. Children should avoid drinks with caffeine including:  ¨ Soft drinks.  ¨ Coffee.  ¨ Tea.  ¨ However, some older children (adolescents) may find these drinks helpful in improving their attention. Because it can also be common for adolescents with ADHD to become addicted to caffeine, talk with your health care provider about what is a safe amount of caffeine intake for your child.  · Children with ADHD need consistent rules that they can understand and follow. If rules are followed, give small rewards. Children with ADHD often receive, and expect, criticism. Look for good behavior and praise it. Set realistic goals. Give clear instructions. Look for activities that can foster success and self-esteem. Make time for pleasant activities with your child. Give lots of affection.  · Parents are their children's greatest advocates. Learn as much as possible about ADHD. This helps you become a stronger and better advocate for your child. It also helps you educate your child's teachers and instructors  if they feel inadequate in these areas. Parent support groups are often helpful. A national group with local chapters is called Children and Adults with Attention Deficit Hyperactivity Disorder (CHADD).  SEEK MEDICAL CARE IF:  · Your child has repeated muscle twitches, cough, or speech outbursts.  · Your child has sleep problems.  · Your child has a marked loss of appetite.  · Your child develops depression.  · Your child has new or worsening behavioral problems.  · Your child develops dizziness.  · Your child has a racing heart.  · Your child has stomach pains.  · Your child develops headaches.  SEEK IMMEDIATE MEDICAL CARE IF:  · Your child has been diagnosed with depression or anxiety and the symptoms seem to be getting worse.  · Your child has been depressed and suddenly appears to have increased energy or motivation.  · You are worried that your child is having a bad reaction to a medication he or she is taking for ADHD.     This information is not intended to replace advice given to you by your health care provider. Make sure you discuss any questions you have with your   health care provider.     Document Released: 08/10/2002 Document Revised: 08/25/2013 Document Reviewed: 04/27/2013  Elsevier Interactive Patient Education ©2016 Elsevier Inc[employer]

## 2015-08-09 ENCOUNTER — Encounter: Payer: Self-pay | Admitting: Internal Medicine

## 2015-08-09 LAB — TESTOSTERONE, FREE, TOTAL, SHBG
Sex Hormone Binding: 71 nmol/L — ABNORMAL HIGH (ref 10–50)
Testosterone, Free: 68.1 pg/mL (ref 47.0–244.0)
Testosterone-% Free: 1.2 % — ABNORMAL LOW (ref 1.6–2.9)
Testosterone: 551 ng/dL (ref 300–890)

## 2015-08-09 NOTE — Progress Notes (Addendum)
Subjective:  Patient ID: Antonio James, male    DOB: 1985/10/24  Age: 29 y.o. MRN: [id number]  CC: ADHD   HPI Antonio MarinerRobert C Diveley presents for refill on Vyvanse.  Pt states ADD status overall stable on current meds with overall good compliance and tolerability, and good effectiveness with respect to ability for concentration and task completion.  Outpatient Prescriptions Prior to Visit  Medication Sig Dispense Refill  . cholecalciferol (VITAMIN D) 1000 UNITS tablet Take 1,000 Units by mouth daily.    . clomiPHENE (CLOMID) 50 MG tablet 1/4 tab, 3 times per week.  Please disregard the rx just sent for QD 10 tablet 3  . Fish Oil-Cholecalciferol (FISH OIL + D3 PO) Take by mouth.    Marland Kitchen. glucosamine-chondroitin 500-400 MG tablet Take 1 tablet by mouth 3 (three) times daily.    . pantoprazole (PROTONIX) 40 MG tablet Take 1 tablet (40 mg total) by mouth daily. 30 tablet 6  . lisdexamfetamine (VYVANSE) 40 MG capsule Take 1 capsule (40 mg total) by mouth every morning. 30 capsule 0  . LORazepam (ATIVAN) 2 MG tablet Take 1 tablet (2 mg total) by mouth every 6 (six) hours as needed for anxiety. 60 tablet 0  . Multiple Vitamin (MULTIVITAMIN) tablet Take 1 tablet by mouth daily.     No facility-administered medications prior to visit.    ROS Review of Systems  Constitutional: Negative.  Negative for fever, chills, diaphoresis, appetite change and fatigue.  HENT: Negative.   Eyes: Negative.   Respiratory: Negative.  Negative for cough, choking, chest tightness, shortness of breath and stridor.   Cardiovascular: Negative.  Negative for chest pain, palpitations and leg swelling.  Gastrointestinal: Negative.  Negative for nausea, vomiting, abdominal pain, diarrhea, constipation and blood in stool.  Endocrine: Negative.   Genitourinary: Negative.   Musculoskeletal: Negative.  Negative for myalgias, back pain, arthralgias and neck pain.  Skin: Negative.  Negative for color change and rash.    Allergic/Immunologic: Negative.   Neurological: Negative.   Hematological: Negative.  Negative for adenopathy. Does not bruise/bleed easily.  Psychiatric/Behavioral: Positive for decreased concentration. Negative for suicidal ideas, hallucinations, behavioral problems, confusion, sleep disturbance, self-injury, dysphoric mood and agitation. The patient is not nervous/anxious and is not hyperactive.     Objective:  BP 124/78 mmHg  Pulse 68  Temp(Src) 98.7 F (37.1 C) (Oral)  Resp 20  Ht 6\' 3"  (1.905 m)  Wt 214 lb (97.07 kg)  BMI 26.75 kg/m2  SpO2 98%  BP Readings from Last 3 Encounters:  08/08/15 124/78  03/22/15 128/84  02/16/15 106/64    Wt Readings from Last 3 Encounters:  08/08/15 214 lb (97.07 kg)  03/22/15 209 lb (94.802 kg)  02/16/15 213 lb (96.616 kg)    Physical Exam  Constitutional: He is oriented to person, place, and time. No distress.  HENT:  Head: Normocephalic and atraumatic.  Mouth/Throat: Oropharynx is clear and moist. No oropharyngeal exudate.  Eyes: Conjunctivae are normal. Right eye exhibits no discharge. Left eye exhibits no discharge. No scleral icterus.  Neck: Normal range of motion. Neck supple. No JVD present. No tracheal deviation present. No thyromegaly present.  Cardiovascular: Normal rate, regular rhythm, normal heart sounds and intact distal pulses.  Exam reveals no gallop and no friction rub.   No murmur heard. Pulmonary/Chest: Effort normal and breath sounds normal. No stridor. No respiratory distress. He has no wheezes. He has no rales. He exhibits no tenderness.  Abdominal: Soft. Bowel sounds are normal. He exhibits  no distension and no mass. There is no tenderness. There is no rebound and no guarding.  Musculoskeletal: Normal range of motion. He exhibits no edema or tenderness.  Lymphadenopathy:    He has no cervical adenopathy.  Neurological: He is oriented to person, place, and time.  Skin: Skin is warm and dry. No rash noted. He is  not diaphoretic. No erythema. No pallor.  Vitals reviewed.   Lab Results  Component Value Date   WBC 5.5 08/08/2015   HGB 16.2 08/08/2015   HCT 48.1 08/08/2015   PLT 140.0* 08/08/2015   GLUCOSE 70 08/08/2015   CHOL 168 02/16/2015   TRIG 62.0 02/16/2015   HDL 49.70 02/16/2015   LDLCALC 106* 02/16/2015   ALT 26 08/08/2015   AST 26 08/08/2015   NA 140 08/08/2015   K 4.0 08/08/2015   CL 103 08/08/2015   CREATININE 1.18 08/08/2015   BUN 23 08/08/2015   CO2 30 08/08/2015   TSH 1.53 02/16/2015    Mr Shoulder Left W Contrast  11/30/2014  CLINICAL DATA:  Left shoulder pain with exercise for approximately 6 months. No specific injury or prior relevant surgery. Initial encounter. EXAM: MR ARTHROGRAM OF THE LEFT SHOULDER TECHNIQUE: Multiplanar, multisequence MR imaging of the left shoulder was performed following the administration of intra-articular contrast. CONTRAST:  See Injection Documentation. COMPARISON:  Left shoulder MR arthrogram 06/06/2006. Injection image today. FINDINGS: Glenohumeral Joint: Adequately distended with contrast.No extra-articular contrast extension demonstrated.No chondral defect or loose body observed. Labrum: No evidence of labral tear.The glenohumeral ligaments appear intact. Biceps long head:  Intact and normally positioned. Acromioclavicular Joint: The acromion is type 1. There are mild acromioclavicular degenerative changes. No significant fluid is present in the subacromial - subdeltoid bursa. Rotator cuff: Intact without significant tendinosis. Mild heterogeneity within the superior subscapularis recess attributed to the injection. Muscles:  No focal muscular atrophy or edema. Bones:  No significant extra-articular osseous findings. IMPRESSION: No acute findings or significant changes. The rotator cuff and labrum appear intact. Electronically Signed   By: Carey Bullocks M.D.   On: 11/30/2014 16:54   Dg Fluoro Guide Ndl Plc/bx  11/30/2014  CLINICAL DATA:  Left  shoulder pain. FLUOROSCOPY TIME:  Radiation Exposure Index (as provided by the fluoroscopic device): 40.68 microGray*m2 Fluoroscopy Time (in minutes and seconds):  0 minutes 18 seconds PROCEDURE: LEFT SHOULDER INJECTION UNDER FLUOROSCOPY An appropriate skin entrance site was determined. The site was marked, prepped with Betadine, draped in the usual sterile fashion, and infiltrated locally with buffered Lidocaine. 22 gauge spinal needle was advanced to the superomedial margin of the humeral head under intermittent fluoroscopy. 1 ml of Lidocaine injected easily. A mixture of 0.1 mL of Multihance, 5 mL of 1% lidocaine, and 15 mL of Omnipaque 180 was then used to opacify the left shoulder capsule. No immediate complication. IMPRESSION: Technically successful left shoulder injection for MRI. Electronically Signed   By: Sebastian Ache   On: 11/30/2014 15:37    Assessment & Plan:   Kamoni was seen today for adhd.  Diagnoses and all orders for this visit:  Need for prophylactic vaccination and inoculation against influenza -     Flu Vaccine QUAD 36+ mos PF IM (Fluarix & Fluzone Quad PF)  Adult ADHD- he is doing well at the current dose of Vyvanse, will continue -     Discontinue: lisdexamfetamine (VYVANSE) 40 MG capsule; Take 1 capsule (40 mg total) by mouth every morning. -     Discontinue: lisdexamfetamine (VYVANSE) 40 MG  capsule; Take 1 capsule (40 mg total) by mouth every morning. -     lisdexamfetamine (VYVANSE) 40 MG capsule; Take 1 capsule (40 mg total) by mouth every morning.  Hypogonadism male- his urologist has started Clomiphene and he is feeling well, I will recheck his testosterone level and monitor his labs for complications -     Testosterone, Free, Total, SHBG; Future -     Comprehensive metabolic panel; Future -     CBC with Differential/Platelet; Future   I have discontinued Mr. Habig multivitamin and LORazepam. I am also having him maintain his Fish Oil-Cholecalciferol (FISH OIL +  D3 PO), pantoprazole, cholecalciferol, glucosamine-chondroitin, clomiPHENE, Probiotic Product (SOLUBLE FIBER/PROBIOTICS PO), and lisdexamfetamine.  Meds ordered this encounter  Medications  . Probiotic Product (SOLUBLE FIBER/PROBIOTICS PO)    Sig: Take by mouth.  . DISCONTD: lisdexamfetamine (VYVANSE) 40 MG capsule    Sig: Take 1 capsule (40 mg total) by mouth every morning.    Dispense:  30 capsule    Refill:  0    Fill on or after 08/08/15  . DISCONTD: lisdexamfetamine (VYVANSE) 40 MG capsule    Sig: Take 1 capsule (40 mg total) by mouth every morning.    Dispense:  30 capsule    Refill:  0    Fill on or after 09/08/15  . lisdexamfetamine (VYVANSE) 40 MG capsule    Sig: Take 1 capsule (40 mg total) by mouth every morning.    Dispense:  30 capsule    Refill:  0    Fill on or after 10/09/15     Follow-up: Return in about 4 months (around 12/07/2015).  Sanda Linger, MD

## 2015-08-15 ENCOUNTER — Ambulatory Visit: Payer: Self-pay | Admitting: Podiatry

## 2015-09-23 ENCOUNTER — Ambulatory Visit: Payer: BLUE CROSS/BLUE SHIELD | Admitting: Endocrinology

## 2015-11-08 ENCOUNTER — Other Ambulatory Visit: Payer: Self-pay | Admitting: Internal Medicine

## 2015-11-08 ENCOUNTER — Other Ambulatory Visit: Payer: Self-pay | Admitting: *Deleted

## 2015-11-08 NOTE — Telephone Encounter (Signed)
Received refill request for lorazepam 2 mg tab last refilled 01/17/15 for 60 tabs last OV 2/2 /16 . Refill is denied patient needs office visit.

## 2015-11-11 NOTE — Telephone Encounter (Signed)
MD out of office pls advise on refill.../lmb 

## 2015-11-11 NOTE — Telephone Encounter (Signed)
I do not see this on his current med list and will not fill. Needs visit with PCP or PCP to fill.

## 2015-12-25 ENCOUNTER — Other Ambulatory Visit: Payer: Self-pay | Admitting: Internal Medicine

## 2015-12-25 DIAGNOSIS — F909 Attention-deficit hyperactivity disorder, unspecified type: Secondary | ICD-10-CM

## 2015-12-26 MED ORDER — LISDEXAMFETAMINE DIMESYLATE 40 MG PO CAPS: 40.0000 mg | ORAL_CAPSULE | ORAL | Status: DC

## 2015-12-26 NOTE — Addendum Note (Signed)
Addended by: Etta GrandchildJONES, Elbert Polyakov L on: 12/26/2015 08:17 AM   Modules accepted: Orders

## 2016-02-10 ENCOUNTER — Telehealth: Payer: Self-pay | Admitting: Internal Medicine

## 2016-02-10 DIAGNOSIS — F909 Attention-deficit hyperactivity disorder, unspecified type: Secondary | ICD-10-CM

## 2016-02-11 MED ORDER — LISDEXAMFETAMINE DIMESYLATE 40 MG PO CAPS: 40.0000 mg | ORAL_CAPSULE | ORAL | Status: DC

## 2016-02-13 NOTE — Telephone Encounter (Signed)
Place rx in cabinet for pick-up.../lmb 

## 2016-04-02 ENCOUNTER — Other Ambulatory Visit: Payer: Self-pay | Admitting: Internal Medicine

## 2016-04-02 DIAGNOSIS — F909 Attention-deficit hyperactivity disorder, unspecified type: Secondary | ICD-10-CM

## 2016-04-03 MED ORDER — LISDEXAMFETAMINE DIMESYLATE 40 MG PO CAPS: 40.0000 mg | ORAL_CAPSULE | ORAL | 0 refills | Status: DC

## 2016-05-14 ENCOUNTER — Other Ambulatory Visit: Payer: Self-pay | Admitting: Internal Medicine

## 2016-05-14 DIAGNOSIS — F909 Attention-deficit hyperactivity disorder, unspecified type: Secondary | ICD-10-CM

## 2016-05-15 ENCOUNTER — Other Ambulatory Visit: Payer: Self-pay | Admitting: Internal Medicine

## 2016-05-15 DIAGNOSIS — F909 Attention-deficit hyperactivity disorder, unspecified type: Secondary | ICD-10-CM

## 2016-05-15 MED ORDER — LISDEXAMFETAMINE DIMESYLATE 40 MG PO CAPS: 40.0000 mg | ORAL_CAPSULE | ORAL | 0 refills | Status: DC

## 2016-05-15 NOTE — Telephone Encounter (Signed)
MD printed script for vyvanse put in cabinet up front...Shearon Stalls/lb

## 2016-05-18 ENCOUNTER — Telehealth: Payer: Self-pay

## 2016-05-18 NOTE — Telephone Encounter (Signed)
lisdexamfetamine (VYVANSE) 40 MG  Patient called and said his insurance changed and now he needs a PA on this medication.  Please follow up, Thank you.

## 2016-05-19 NOTE — Telephone Encounter (Signed)
pls advise in PCP absence.  

## 2016-05-20 NOTE — Telephone Encounter (Signed)
If he wishes to continue with the Vyvanse then a PA will be needed.

## 2016-05-21 NOTE — Telephone Encounter (Signed)
Antonio James, can you start the PA for pt rx.

## 2016-05-21 NOTE — Telephone Encounter (Signed)
Pt stating he picked it up--took to pharmacy but pharmacy said he need an authorization in order for him to get it refill. Please help, pt is unsure

## 2016-05-21 NOTE — Telephone Encounter (Signed)
rx was printed by PCP on 05/15/2016 and picked up on 05/16/2016 (pt signed rx log up front).

## 2016-05-21 NOTE — Telephone Encounter (Signed)
PA initiated and APPROVED through 05/21/2017 via CoverMyMeds key [id number]. Pt advised

## 2016-05-22 ENCOUNTER — Encounter: Payer: Self-pay | Admitting: Internal Medicine

## 2016-05-23 ENCOUNTER — Encounter: Payer: Self-pay | Admitting: Internal Medicine

## 2016-06-07 ENCOUNTER — Ambulatory Visit (INDEPENDENT_AMBULATORY_CARE_PROVIDER_SITE_OTHER): Payer: BLUE CROSS/BLUE SHIELD | Admitting: Internal Medicine

## 2016-06-07 ENCOUNTER — Other Ambulatory Visit (INDEPENDENT_AMBULATORY_CARE_PROVIDER_SITE_OTHER): Payer: BLUE CROSS/BLUE SHIELD

## 2016-06-07 ENCOUNTER — Encounter: Payer: Self-pay | Admitting: Internal Medicine

## 2016-06-07 VITALS — BP 106/68 | HR 60 | Temp 97.8°F | Ht 75.0 in | Wt 220.0 lb

## 2016-06-07 DIAGNOSIS — Z23 Encounter for immunization: Secondary | ICD-10-CM

## 2016-06-07 DIAGNOSIS — Z0000 Encounter for general adult medical examination without abnormal findings: Secondary | ICD-10-CM

## 2016-06-07 DIAGNOSIS — F909 Attention-deficit hyperactivity disorder, unspecified type: Secondary | ICD-10-CM

## 2016-06-07 LAB — COMPREHENSIVE METABOLIC PANEL
ALT: 25 U/L (ref 0–53)
AST: 29 U/L (ref 0–37)
Albumin: 4.1 g/dL (ref 3.5–5.2)
Alkaline Phosphatase: 53 U/L (ref 39–117)
BUN: 23 mg/dL (ref 6–23)
CALCIUM: 8.8 mg/dL (ref 8.4–10.5)
CHLORIDE: 102 meq/L (ref 96–112)
CO2: 27 meq/L (ref 19–32)
CREATININE: 1.03 mg/dL (ref 0.40–1.50)
GFR: 89.54 mL/min (ref >60.00)
Glucose, Bld: 91 mg/dL (ref 70–99)
Potassium: 3.9 mEq/L (ref 3.5–5.1)
Sodium: 138 mEq/L (ref 135–145)
Total Bilirubin: 0.7 mg/dL (ref 0.2–1.2)
Total Protein: 6.7 g/dL (ref 6.0–8.3)

## 2016-06-07 LAB — CBC WITH DIFFERENTIAL/PLATELET
BASOS PCT: 0.5 % (ref 0.0–3.0)
Basophils Absolute: 0 10*3/uL (ref 0.0–0.1)
EOS ABS: 0 10*3/uL (ref 0.0–0.7)
Eosinophils Relative: 0.6 % (ref 0.0–5.0)
HEMATOCRIT: 47.7 % (ref 39.0–52.0)
Hemoglobin: 16.5 g/dL (ref 13.0–17.0)
LYMPHS ABS: 2 10*3/uL (ref 0.7–4.0)
LYMPHS PCT: 37.1 % (ref 12.0–46.0)
MCHC: 34.6 g/dL (ref 30.0–36.0)
MCV: 87.9 fl (ref 78.0–100.0)
MONO ABS: 0.4 10*3/uL (ref 0.1–1.0)
Monocytes Relative: 8.1 % (ref 3.0–12.0)
NEUTROS ABS: 3 10*3/uL (ref 1.4–7.7)
Neutrophils Relative %: 53.7 % (ref 43.0–77.0)
PLATELETS: 156 10*3/uL (ref 150.0–400.0)
RBC: 5.42 Mil/uL (ref 4.22–5.81)
RDW: 13.9 % (ref 11.5–15.5)
WBC: 5.5 10*3/uL (ref 4.0–10.5)

## 2016-06-07 LAB — LIPID PANEL
CHOL/HDL RATIO: 3
Cholesterol: 148 mg/dL (ref 0–200)
HDL: 51.3 mg/dL (ref >39.00)
LDL CALC: 85 mg/dL (ref 0–99)
NonHDL: 97.03
TRIGLYCERIDES: 60 mg/dL (ref 0.0–149.0)
VLDL: 12 mg/dL (ref 0.0–40.0)

## 2016-06-07 LAB — TSH: TSH: 1.67 u[IU]/mL (ref 0.35–4.50)

## 2016-06-07 MED ORDER — LISDEXAMFETAMINE DIMESYLATE 40 MG PO CAPS: 40.0000 mg | ORAL_CAPSULE | ORAL | 0 refills | Status: DC

## 2016-06-07 NOTE — Progress Notes (Signed)
Subjective:  Patient ID: Antonio James, male    DOB: [birth year]  Age: 30 y.o. MRN: [id number]  CC: Annual Exam   HPI [name] presents for a CPX.  Pt states ADD status overall stable on current meds with overall good compliance and tolerability, and good effectiveness with respect to ability for concentration and task completion.  He saw his urologist about 2 or 3 months ago for a fertility check and follow-up on clomiphene and tells me that all of that is going well. His wife is pregnant with their first child.  Outpatient Medications Prior to Visit  Medication Sig Dispense Refill  . cholecalciferol (VITAMIN D) 1000 UNITS tablet Take 1,000 Units by mouth daily.    . clomiPHENE (CLOMID) 50 MG tablet 1/4 tab, 3 times per week.  Please disregard the rx just sent for QD 10 tablet 3  . glucosamine-chondroitin 500-400 MG tablet Take 1 tablet by mouth 3 (three) times daily.    . pantoprazole (PROTONIX) 40 MG tablet Take 1 tablet (40 mg total) by mouth daily. 30 tablet 6  . lisdexamfetamine (VYVANSE) 40 MG capsule Take 1 capsule (40 mg total) by mouth every morning. 30 capsule 0  . Fish Oil-Cholecalciferol (FISH OIL + D3 PO) Take by mouth.    . Probiotic Product (SOLUBLE FIBER/PROBIOTICS PO) Take by mouth.     No facility-administered medications prior to visit.     ROS Review of Systems  Constitutional: Negative.  Negative for appetite change, diaphoresis, fatigue and unexpected weight change.  HENT: Negative.  Negative for sinus pressure and trouble swallowing.   Eyes: Negative.  Negative for photophobia and visual disturbance.  Respiratory: Negative.  Negative for cough, choking, chest tightness and shortness of breath.   Cardiovascular: Negative.  Negative for chest pain, palpitations and leg swelling.  Gastrointestinal: Negative.  Negative for abdominal pain, constipation, diarrhea, nausea and vomiting.  Genitourinary: Negative.   Musculoskeletal: Negative.  Negative for  arthralgias, back pain, myalgias and neck pain.  Skin: Negative.   Allergic/Immunologic: Negative.   Neurological: Negative.   Hematological: Negative.  Negative for adenopathy. Does not bruise/bleed easily.  Psychiatric/Behavioral: Negative.     Objective:  BP 106/68 (BP Location: Left Arm, Patient Position: Sitting, Cuff Size: Large)   Pulse 60   Temp 97.8 F (36.6 C) (Oral)   Ht 6\' 3"  (1.905 m)   Wt 220 lb (99.8 kg)   SpO2 98%   BMI 27.50 kg/m   BP Readings from Last 3 Encounters:  06/07/16 106/68  08/08/15 124/78  03/22/15 128/84    Wt Readings from Last 3 Encounters:  06/07/16 220 lb (99.8 kg)  08/08/15 214 lb (97.1 kg)  03/22/15 209 lb (94.8 kg)    Physical Exam  Constitutional: He is oriented to person, place, and time. No distress.  HENT:  Head: Normocephalic and atraumatic.  Mouth/Throat: Oropharynx is clear and moist. No oropharyngeal exudate.  Eyes: Conjunctivae are normal. Right eye exhibits no discharge. Left eye exhibits no discharge. No scleral icterus.  Neck: Normal range of motion. Neck supple. No JVD present. No tracheal deviation present. No thyromegaly present.  Cardiovascular: Normal rate, regular rhythm, normal heart sounds and intact distal pulses.  Exam reveals no gallop and no friction rub.   No murmur heard. Pulmonary/Chest: Effort normal and breath sounds normal. No stridor. No respiratory distress. He has no wheezes. He has no rales. He exhibits no tenderness.  Abdominal: Soft. Bowel sounds are normal. He exhibits no distension and no  mass. There is no tenderness. There is no rebound and no guarding.  Genitourinary:  Genitourinary Comments: GU exam deferred since he just saw his urologist about 2 or 3 months ago  Musculoskeletal: Normal range of motion. He exhibits no edema, tenderness or deformity.  Lymphadenopathy:    He has no cervical adenopathy.  Neurological: He is oriented to person, place, and time.  Skin: Skin is warm and dry. No  rash noted. He is not diaphoretic. No erythema. No pallor.  Psychiatric: He has a normal mood and affect. His behavior is normal. Judgment and thought content normal. His mood appears not anxious. His speech is not rapid and/or pressured, not delayed, not tangential and not slurred. He is not agitated, not aggressive, not slowed, not withdrawn and not combative. Cognition and memory are normal. He does not exhibit a depressed mood. He is communicative. He is attentive.  Vitals reviewed.   Lab Results  Component Value Date   WBC 5.5 06/07/2016   HGB 16.5 06/07/2016   HCT 47.7 06/07/2016   PLT 156.0 06/07/2016   GLUCOSE 91 06/07/2016   CHOL 148 06/07/2016   TRIG 60.0 06/07/2016   HDL 51.30 06/07/2016   LDLCALC 85 06/07/2016   ALT 25 06/07/2016   AST 29 06/07/2016   NA 138 06/07/2016   K 3.9 06/07/2016   CL 102 06/07/2016   CREATININE 1.03 06/07/2016   BUN 23 06/07/2016   CO2 27 06/07/2016   TSH 1.67 06/07/2016    Mr Shoulder Left W Contrast  Result Date: 11/30/2014 CLINICAL DATA:  Left shoulder pain with exercise for approximately 6 months. No specific injury or prior relevant surgery. Initial encounter. EXAM: MR ARTHROGRAM OF THE LEFT SHOULDER TECHNIQUE: Multiplanar, multisequence MR imaging of the left shoulder was performed following the administration of intra-articular contrast. CONTRAST:  See Injection Documentation. COMPARISON:  Left shoulder MR arthrogram 06/06/2006. Injection image today. FINDINGS: Glenohumeral Joint: Adequately distended with contrast.No extra-articular contrast extension demonstrated.No chondral defect or loose body observed. Labrum: No evidence of labral tear.The glenohumeral ligaments appear intact. Biceps long head:  Intact and normally positioned. Acromioclavicular Joint: The acromion is type 1. There are mild acromioclavicular degenerative changes. No significant fluid is present in the subacromial - subdeltoid bursa. Rotator cuff: Intact without  significant tendinosis. Mild heterogeneity within the superior subscapularis recess attributed to the injection. Muscles:  No focal muscular atrophy or edema. Bones:  No significant extra-articular osseous findings. IMPRESSION: No acute findings or significant changes. The rotator cuff and labrum appear intact. Electronically Signed   By: Carey Bullocks M.D.   On: 11/30/2014 16:54   Dg Fluoro Guide Ndl Plc/bx  Result Date: 11/30/2014 CLINICAL DATA:  Left shoulder pain. FLUOROSCOPY TIME:  Radiation Exposure Index (as provided by the fluoroscopic device): 40.68 microGray*m2 Fluoroscopy Time (in minutes and seconds):  0 minutes 18 seconds PROCEDURE: LEFT SHOULDER INJECTION UNDER FLUOROSCOPY An appropriate skin entrance site was determined. The site was marked, prepped with Betadine, draped in the usual sterile fashion, and infiltrated locally with buffered Lidocaine. 22 gauge spinal needle was advanced to the superomedial margin of the humeral head under intermittent fluoroscopy. 1 ml of Lidocaine injected easily. A mixture of 0.1 mL of Multihance, 5 mL of 1% lidocaine, and 15 mL of Omnipaque 180 was then used to opacify the left shoulder capsule. No immediate complication. IMPRESSION: Technically successful left shoulder injection for MRI. Electronically Signed   By: Sebastian Ache   On: 11/30/2014 15:37    Assessment & Plan:  Molly MaduroRobert was seen today for annual exam.  Diagnoses and all orders for this visit:  Routine general medical examination at a health care facility- exam completed, labs ordered and reviewed, vaccines reviewed and updated, he was given patient education material. -     CBC with Differential/Platelet; Future -     Comprehensive metabolic panel; Future -     Lipid panel; Future -     TSH; Future -     HIV antibody; Future  Adult ADHD- he is doing well on the current dose of Vyvanse, I will continue at this dose. -     Discontinue: lisdexamfetamine (VYVANSE) 40 MG capsule; Take 1  capsule (40 mg total) by mouth every morning. -     Discontinue: lisdexamfetamine (VYVANSE) 40 MG capsule; Take 1 capsule (40 mg total) by mouth every morning. -     Discontinue: lisdexamfetamine (VYVANSE) 40 MG capsule; Take 1 capsule (40 mg total) by mouth every morning. -     lisdexamfetamine (VYVANSE) 40 MG capsule; Take 1 capsule (40 mg total) by mouth every morning.  Need for prophylactic vaccination and inoculation against influenza -     Flu Vaccine QUAD 36+ mos IM   I have discontinued Mr. Samella ParrCratch's Fish Oil-Cholecalciferol (FISH OIL + D3 PO) and Probiotic Product (SOLUBLE FIBER/PROBIOTICS PO). I am also having him maintain his pantoprazole, cholecalciferol, glucosamine-chondroitin, clomiPHENE, and lisdexamfetamine.  Meds ordered this encounter  Medications  . DISCONTD: lisdexamfetamine (VYVANSE) 40 MG capsule    Sig: Take 1 capsule (40 mg total) by mouth every morning.    Dispense:  30 capsule    Refill:  0    Fill on or after 06/07/16  . DISCONTD: lisdexamfetamine (VYVANSE) 40 MG capsule    Sig: Take 1 capsule (40 mg total) by mouth every morning.    Dispense:  30 capsule    Refill:  0    Fill on or after 07/08/16  . DISCONTD: lisdexamfetamine (VYVANSE) 40 MG capsule    Sig: Take 1 capsule (40 mg total) by mouth every morning.    Dispense:  30 capsule    Refill:  0    Fill on or after 08/07/16  . lisdexamfetamine (VYVANSE) 40 MG capsule    Sig: Take 1 capsule (40 mg total) by mouth every morning.    Dispense:  30 capsule    Refill:  0    Fill on or after 09/07/16     Follow-up: Return if symptoms worsen or fail to improve.  Sanda Lingerhomas Elan Brainerd, MD

## 2016-06-07 NOTE — Patient Instructions (Signed)

## 2016-06-07 NOTE — Progress Notes (Signed)
Pre visit review using our clinic review tool, if applicable. No additional management support is needed unless otherwise documented below in the visit note. 

## 2016-06-08 ENCOUNTER — Encounter: Payer: Self-pay | Admitting: Internal Medicine

## 2016-06-08 LAB — HIV ANTIBODY (ROUTINE TESTING W REFLEX): HIV 1&2 Ab, 4th Generation: NONREACTIVE

## 2016-11-05 ENCOUNTER — Other Ambulatory Visit: Payer: Self-pay | Admitting: Internal Medicine

## 2016-11-05 DIAGNOSIS — F909 Attention-deficit hyperactivity disorder, unspecified type: Secondary | ICD-10-CM

## 2016-11-05 MED ORDER — LISDEXAMFETAMINE DIMESYLATE 40 MG PO CAPS: 40.0000 mg | ORAL_CAPSULE | ORAL | 0 refills | Status: DC

## 2016-12-30 ENCOUNTER — Telehealth: Payer: Self-pay | Admitting: Internal Medicine

## 2016-12-30 DIAGNOSIS — F909 Attention-deficit hyperactivity disorder, unspecified type: Secondary | ICD-10-CM

## 2016-12-31 MED ORDER — LISDEXAMFETAMINE DIMESYLATE 40 MG PO CAPS: 40.0000 mg | ORAL_CAPSULE | ORAL | 0 refills | Status: DC

## 2017-01-07 ENCOUNTER — Telehealth: Payer: Self-pay | Admitting: *Deleted

## 2017-01-07 NOTE — Telephone Encounter (Signed)
Left msg on triage stating he has been trying to get his Vyvanse & Clomid refill needing PA for both medications. Pharmacy states they have sent PA.Step have you seen PA for these medications....Raechel Chute/lmb

## 2017-01-07 NOTE — Telephone Encounter (Signed)
[facility]  ID [id number] Group [id number] BIN [id number] RX PCN [id number]  Provider #  [phone] Patient is currently out of medication.  Will insurance company allow same day phone PA?  Please follow back up in regard.

## 2017-01-07 NOTE — Telephone Encounter (Signed)
I have not but have printed your message. There are a handful in front of [name].

## 2017-01-08 NOTE — Telephone Encounter (Addendum)
Vyvanse 40 mg capsule PA Key: [id number]  Clomid 50 mg tablet PA Key: [id number]  Clinical information submitted

## 2017-01-10 NOTE — Telephone Encounter (Signed)
Vyvanse PA was approved.   Will pt and inform of same.   PA for clomid does not look good because there are no recent notes or labs indicating need for that medication.

## 2017-01-11 NOTE — Telephone Encounter (Signed)
Clomid has not been declined. I just feel like it might be.

## 2017-01-11 NOTE — Telephone Encounter (Signed)
That was part of the message I left.

## 2017-01-11 NOTE — Telephone Encounter (Signed)
Left vm for patient notifying him.  Did ask him to call back to inform us if he would like a different med in place of clomid or if he wanted to pay out of pocket but told him we were still waiting on a final answer.

## 2017-01-17 NOTE — Telephone Encounter (Signed)
Notified patient.  Patient is going to contact pharmacy.  I notified patient to give us a call back if we need to do anything else.

## 2017-01-17 NOTE — Telephone Encounter (Signed)
Can you call pt and let him know that the PA for clomid came back as follows:   This medication is on your plan's list of covered drugs. Prior authorization is not required at this time.

## 2017-02-13 ENCOUNTER — Other Ambulatory Visit: Payer: Self-pay | Admitting: Internal Medicine

## 2017-02-13 DIAGNOSIS — F909 Attention-deficit hyperactivity disorder, unspecified type: Secondary | ICD-10-CM

## 2017-02-13 MED ORDER — LISDEXAMFETAMINE DIMESYLATE 40 MG PO CAPS: 40.0000 mg | ORAL_CAPSULE | ORAL | 0 refills | Status: DC

## 2017-02-14 ENCOUNTER — Other Ambulatory Visit: Payer: Self-pay | Admitting: Internal Medicine

## 2017-02-14 DIAGNOSIS — F909 Attention-deficit hyperactivity disorder, unspecified type: Secondary | ICD-10-CM

## 2017-02-14 DIAGNOSIS — E291 Testicular hypofunction: Secondary | ICD-10-CM

## 2017-02-14 MED ORDER — LISDEXAMFETAMINE DIMESYLATE 40 MG PO CAPS: 40.0000 mg | ORAL_CAPSULE | ORAL | 0 refills | Status: DC

## 2017-02-14 MED ORDER — CLOMIPHENE CITRATE 50 MG PO TABS: ORAL_TABLET | ORAL | 3 refills | Status: DC

## 2017-03-05 ENCOUNTER — Ambulatory Visit: Payer: Self-pay | Admitting: Internal Medicine

## 2017-03-07 ENCOUNTER — Ambulatory Visit: Payer: Self-pay | Admitting: Endocrinology

## 2017-03-27 DIAGNOSIS — E291 Testicular hypofunction: Secondary | ICD-10-CM | POA: Diagnosis not present

## 2017-03-28 DIAGNOSIS — N529 Male erectile dysfunction, unspecified: Secondary | ICD-10-CM | POA: Diagnosis not present

## 2017-04-04 ENCOUNTER — Ambulatory Visit (INDEPENDENT_AMBULATORY_CARE_PROVIDER_SITE_OTHER): Payer: 59 | Admitting: Internal Medicine

## 2017-04-04 ENCOUNTER — Encounter: Payer: Self-pay | Admitting: Internal Medicine

## 2017-04-04 ENCOUNTER — Other Ambulatory Visit (INDEPENDENT_AMBULATORY_CARE_PROVIDER_SITE_OTHER): Payer: 59

## 2017-04-04 VITALS — BP 120/80 | HR 68 | Temp 98.2°F | Resp 16 | Ht 75.0 in | Wt 223.0 lb

## 2017-04-04 DIAGNOSIS — Z0000 Encounter for general adult medical examination without abnormal findings: Secondary | ICD-10-CM | POA: Diagnosis not present

## 2017-04-04 DIAGNOSIS — F909 Attention-deficit hyperactivity disorder, unspecified type: Secondary | ICD-10-CM

## 2017-04-04 DIAGNOSIS — E291 Testicular hypofunction: Secondary | ICD-10-CM

## 2017-04-04 LAB — COMPREHENSIVE METABOLIC PANEL WITH GFR
ALT: 26 U/L (ref 0–53)
AST: 26 U/L (ref 0–37)
Albumin: 4.4 g/dL (ref 3.5–5.2)
Alkaline Phosphatase: 60 U/L (ref 39–117)
BUN: 27 mg/dL — ABNORMAL HIGH (ref 6–23)
CO2: 28 meq/L (ref 19–32)
Calcium: 9 mg/dL (ref 8.4–10.5)
Chloride: 103 meq/L (ref 96–112)
Creatinine, Ser: 1.28 mg/dL (ref 0.40–1.50)
GFR: 69.31 mL/min
Glucose, Bld: 92 mg/dL (ref 70–99)
Potassium: 4.1 meq/L (ref 3.5–5.1)
Sodium: 138 meq/L (ref 135–145)
Total Bilirubin: 0.5 mg/dL (ref 0.2–1.2)
Total Protein: 6.8 g/dL (ref 6.0–8.3)

## 2017-04-04 LAB — CBC WITH DIFFERENTIAL/PLATELET
Basophils Absolute: 0 10*3/uL (ref 0.0–0.1)
Basophils Relative: 0.7 % (ref 0.0–3.0)
EOS ABS: 0.1 10*3/uL (ref 0.0–0.7)
Eosinophils Relative: 1.5 % (ref 0.0–5.0)
HEMATOCRIT: 49.3 % (ref 39.0–52.0)
HEMOGLOBIN: 16.5 g/dL (ref 13.0–17.0)
LYMPHS PCT: 27.8 % (ref 12.0–46.0)
Lymphs Abs: 1.3 10*3/uL (ref 0.7–4.0)
MCHC: 33.4 g/dL (ref 30.0–36.0)
MCV: 91.7 fl (ref 78.0–100.0)
MONOS PCT: 10.5 % (ref 3.0–12.0)
Monocytes Absolute: 0.5 10*3/uL (ref 0.1–1.0)
Neutro Abs: 2.7 10*3/uL (ref 1.4–7.7)
Neutrophils Relative %: 59.5 % (ref 43.0–77.0)
Platelets: 158 10*3/uL (ref 150.0–400.0)
RBC: 5.38 Mil/uL (ref 4.22–5.81)
RDW: 14.8 % (ref 11.5–15.5)
WBC: 4.5 10*3/uL (ref 4.0–10.5)

## 2017-04-04 MED ORDER — LISDEXAMFETAMINE DIMESYLATE 40 MG PO CAPS: 40.0000 mg | ORAL_CAPSULE | ORAL | 0 refills | Status: DC

## 2017-04-04 MED ORDER — CLOMIPHENE CITRATE 50 MG PO TABS: ORAL_TABLET | ORAL | 3 refills | Status: DC

## 2017-04-04 NOTE — Patient Instructions (Signed)

## 2017-04-04 NOTE — Progress Notes (Signed)
Subjective:  Patient ID: Antonio James, male    DOB: June 02, 1986  Age: 31 y.o. MRN: [id number]  CC: ADHD and Annual Exam   HPI Antonio James presents for a CPX.  Pt states ADD status overall stable on current meds with overall good compliance and tolerability, and good effectiveness with respect to ability for concentration and task completion.  He continues to take clomiphene for hypogonadism and wants to have his testosterone level checked. He complains of fatigue and erectile dysfunction.  Outpatient Medications Prior to Visit  Medication Sig Dispense Refill  . cholecalciferol (VITAMIN D) 1000 UNITS tablet Take 1,000 Units by mouth daily.    . pantoprazole (PROTONIX) 40 MG tablet Take 1 tablet (40 mg total) by mouth daily. 30 tablet 6  . clomiPHENE (CLOMID) 50 MG tablet 1/4 tab, 3 times per week. 10 tablet 3  . glucosamine-chondroitin 500-400 MG tablet Take 1 tablet by mouth 3 (three) times daily.    Marland Kitchen. lisdexamfetamine (VYVANSE) 40 MG capsule Take 1 capsule (40 mg total) by mouth every morning. 30 capsule 0   No facility-administered medications prior to visit.     ROS Review of Systems  Constitutional: Positive for fatigue. Negative for diaphoresis and fever.  HENT: Negative.   Eyes: Negative.  Negative for visual disturbance.  Respiratory: Negative.  Negative for chest tightness, shortness of breath and wheezing.   Cardiovascular: Negative for chest pain, palpitations and leg swelling.  Gastrointestinal: Negative for abdominal pain, constipation, diarrhea and vomiting.  Endocrine: Negative.   Genitourinary: Negative.  Negative for dysuria.       +ED  Musculoskeletal: Negative.  Negative for back pain and myalgias.  Skin: Negative.   Allergic/Immunologic: Negative.   Neurological: Negative.  Negative for dizziness and weakness.  Hematological: Negative.  Negative for adenopathy. Does not bruise/bleed easily.  Psychiatric/Behavioral: Positive for decreased  concentration. Negative for agitation, behavioral problems, confusion, dysphoric mood, hallucinations, self-injury, sleep disturbance and suicidal ideas. The patient is not nervous/anxious and is not hyperactive.     Objective:  BP 120/80 (BP Location: Left Arm, Patient Position: Sitting, Cuff Size: Large)   Pulse 68   Temp 98.2 F (36.8 C) (Oral)   Resp 16   Ht 6\' 3"  (1.905 m)   Wt 223 lb (101.2 kg)   SpO2 98%   BMI 27.87 kg/m   BP Readings from Last 3 Encounters:  04/04/17 120/80  06/07/16 106/68  08/08/15 124/78    Wt Readings from Last 3 Encounters:  04/04/17 223 lb (101.2 kg)  06/07/16 220 lb (99.8 kg)  08/08/15 214 lb (97.1 kg)    Physical Exam  Constitutional: He is oriented to person, place, and time. No distress.  HENT:  Mouth/Throat: Oropharynx is clear and moist. No oropharyngeal exudate.  Eyes: Conjunctivae are normal. Right eye exhibits no discharge. Left eye exhibits no discharge. No scleral icterus.  Neck: Normal range of motion. Neck supple. No JVD present. No thyromegaly present.  Cardiovascular: Normal rate, regular rhythm and intact distal pulses.  Exam reveals no gallop and no friction rub.   No murmur heard. Pulmonary/Chest: Effort normal and breath sounds normal. No respiratory distress. He has no wheezes. He has no rales. He exhibits no tenderness.  Abdominal: Soft. Bowel sounds are normal. He exhibits no distension and no mass. There is no tenderness. There is no rebound and no guarding.  Musculoskeletal: Normal range of motion. He exhibits no edema, tenderness or deformity.  Lymphadenopathy:    He has no cervical  adenopathy.  Neurological: He is alert and oriented to person, place, and time.  Skin: Skin is warm and dry. No rash noted. He is not diaphoretic. No erythema. No pallor.  Psychiatric: He has a normal mood and affect. His behavior is normal. Judgment and thought content normal.  Vitals reviewed.   Lab Results  Component Value Date    WBC 4.5 04/04/2017   HGB 16.5 04/04/2017   HCT 49.3 04/04/2017   PLT 158.0 04/04/2017   GLUCOSE 92 04/04/2017   CHOL 148 06/07/2016   TRIG 60.0 06/07/2016   HDL 51.30 06/07/2016   LDLCALC 85 06/07/2016   ALT 26 04/04/2017   AST 26 04/04/2017   NA 138 04/04/2017   K 4.1 04/04/2017   CL 103 04/04/2017   CREATININE 1.28 04/04/2017   BUN 27 (H) 04/04/2017   CO2 28 04/04/2017   TSH 1.67 06/07/2016    Mr Shoulder Left W Contrast  Result Date: 11/30/2014 CLINICAL DATA:  Left shoulder pain with exercise for approximately 6 months. No specific injury or prior relevant surgery. Initial encounter. EXAM: MR ARTHROGRAM OF THE LEFT SHOULDER TECHNIQUE: Multiplanar, multisequence MR imaging of the left shoulder was performed following the administration of intra-articular contrast. CONTRAST:  See Injection Documentation. COMPARISON:  Left shoulder MR arthrogram 06/06/2006. Injection image today. FINDINGS: Glenohumeral Joint: Adequately distended with contrast.No extra-articular contrast extension demonstrated.No chondral defect or loose body observed. Labrum: No evidence of labral tear.The glenohumeral ligaments appear intact. Biceps long head:  Intact and normally positioned. Acromioclavicular Joint: The acromion is type 1. There are mild acromioclavicular degenerative changes. No significant fluid is present in the subacromial - subdeltoid bursa. Rotator cuff: Intact without significant tendinosis. Mild heterogeneity within the superior subscapularis recess attributed to the injection. Muscles:  No focal muscular atrophy or edema. Bones:  No significant extra-articular osseous findings. IMPRESSION: No acute findings or significant changes. The rotator cuff and labrum appear intact. Electronically Signed   By: Carey Bullocks M.D.   On: 11/30/2014 16:54   Dg Fluoro Guide Ndl Plc/bx  Result Date: 11/30/2014 CLINICAL DATA:  Left shoulder pain. FLUOROSCOPY TIME:  Radiation Exposure Index (as provided by the  fluoroscopic device): 40.68 microGray*m2 Fluoroscopy Time (in minutes and seconds):  0 minutes 18 seconds PROCEDURE: LEFT SHOULDER INJECTION UNDER FLUOROSCOPY An appropriate skin entrance site was determined. The site was marked, prepped with Betadine, draped in the usual sterile fashion, and infiltrated locally with buffered Lidocaine. 22 gauge spinal needle was advanced to the superomedial margin of the humeral head under intermittent fluoroscopy. 1 ml of Lidocaine injected easily. A mixture of 0.1 mL of Multihance, 5 mL of 1% lidocaine, and 15 mL of Omnipaque 180 was then used to opacify the left shoulder capsule. No immediate complication. IMPRESSION: Technically successful left shoulder injection for MRI. Electronically Signed   By: Sebastian Ache   On: 11/30/2014 15:37    Assessment & Plan:   Antonio James was seen today for adhd and annual exam.  Diagnoses and all orders for this visit:  Adult ADHD- will continue the current dose of Vyvanse -     Discontinue: lisdexamfetamine (VYVANSE) 40 MG capsule; Take 1 capsule (40 mg total) by mouth every morning. -     Discontinue: lisdexamfetamine (VYVANSE) 40 MG capsule; Take 1 capsule (40 mg total) by mouth every morning. -     Discontinue: lisdexamfetamine (VYVANSE) 40 MG capsule; Take 1 capsule (40 mg total) by mouth every morning. -     lisdexamfetamine (VYVANSE) 40 MG capsule;  Take 1 capsule (40 mg total) by mouth every morning.  Hypogonadism male- his testosterone level is elevated just above 1100. I've asked him to decrease his clomiphene dose from 50 mg a day to 25 mg a day. -     clomiPHENE (CLOMID) 50 MG tablet; One po QD -     Comprehensive metabolic panel; Future -     CBC with Differential/Platelet; Future -     Testosterone Total,Free,Bio, Males; Future  Routine general medical examination at a health care facility- exam completed, no labs indicated, vaccines reviewed, patient education material was given.   I have discontinued Mr.  Samella ParrCratch's glucosamine-chondroitin. I have also changed his clomiPHENE. Additionally, I am having him maintain his pantoprazole, cholecalciferol, and lisdexamfetamine.  Meds ordered this encounter  Medications  . clomiPHENE (CLOMID) 50 MG tablet    Sig: One po QD    Dispense:  30 tablet    Refill:  3  . DISCONTD: lisdexamfetamine (VYVANSE) 40 MG capsule    Sig: Take 1 capsule (40 mg total) by mouth every morning.    Dispense:  30 capsule    Refill:  0    Fill on or after 04/04/17  . DISCONTD: lisdexamfetamine (VYVANSE) 40 MG capsule    Sig: Take 1 capsule (40 mg total) by mouth every morning.    Dispense:  30 capsule    Refill:  0    Fill on or after 05/05/17  . DISCONTD: lisdexamfetamine (VYVANSE) 40 MG capsule    Sig: Take 1 capsule (40 mg total) by mouth every morning.    Dispense:  30 capsule    Refill:  0    Fill on or after 06/04/17  . lisdexamfetamine (VYVANSE) 40 MG capsule    Sig: Take 1 capsule (40 mg total) by mouth every morning.    Dispense:  30 capsule    Refill:  0    Fill on or after 07/05/17     Follow-up: Return in about 6 months (around 10/05/2017).  Sanda Lingerhomas Shivangi Lutz, MD

## 2017-04-05 ENCOUNTER — Encounter: Payer: Self-pay | Admitting: Internal Medicine

## 2017-04-05 ENCOUNTER — Telehealth: Payer: Self-pay | Admitting: Endocrinology

## 2017-04-05 LAB — TESTOSTERONE TOTAL,FREE,BIO, MALES
Albumin: 4.3 g/dL (ref 3.6–5.1)
Sex Hormone Binding: 41 nmol/L (ref 10–50)
TESTOSTERONE BIOAVAILABLE: 318.8 ng/dL (ref 110.0–575.0)
Testosterone, Free: 161.9 pg/mL (ref 46.0–224.0)
Testosterone: 1169 ng/dL — ABNORMAL HIGH (ref 250–827)

## 2017-04-05 NOTE — Telephone Encounter (Signed)
Patient calling to report that he received a no show letter for 03/07/2017 and he sent a message via mychart before it was missed to cancel it. Please advise.  Thank you,  -LL

## 2017-04-08 NOTE — Telephone Encounter (Signed)
I do not see a letter that was sent nor do I see an email sent by the pt but I am removing the NS from his record.

## 2017-05-23 ENCOUNTER — Telehealth: Payer: Self-pay

## 2017-05-23 NOTE — Telephone Encounter (Signed)
lvm -   Fax from pharmacy stated that rx for vyvanse needed a PA and that step therapy was required.   Needed to know if pt wanted to try one of the alternatives indicated.

## 2017-05-27 ENCOUNTER — Other Ambulatory Visit: Payer: Self-pay | Admitting: Internal Medicine

## 2017-05-27 DIAGNOSIS — F909 Attention-deficit hyperactivity disorder, unspecified type: Secondary | ICD-10-CM

## 2017-05-27 MED ORDER — METHYLPHENIDATE HCL ER 36 MG PO TB24: 36.0000 mg | ORAL_TABLET | Freq: Every day | ORAL | 0 refills | Status: DC

## 2017-05-27 NOTE — Telephone Encounter (Signed)
Pt informed of same.  

## 2017-05-27 NOTE — Telephone Encounter (Signed)
changed

## 2017-05-27 NOTE — Telephone Encounter (Signed)
Patient is fine with trying a alternative first.

## 2017-05-27 NOTE — Telephone Encounter (Signed)
Alternatives: SSRI, topiramate, generic mixed amphetamine salts, or methylphenidate IR/ER/LA/CD required.   Please advise

## 2017-05-29 DIAGNOSIS — E291 Testicular hypofunction: Secondary | ICD-10-CM | POA: Diagnosis not present

## 2017-05-29 DIAGNOSIS — N529 Male erectile dysfunction, unspecified: Secondary | ICD-10-CM | POA: Diagnosis not present

## 2017-05-30 NOTE — Telephone Encounter (Signed)
Pt states he was not aware methylphenidate 36 MG PO CR tablet Is Concerta, he states he had this when he was younger  And it did not work.  He would like to do the PA on Vyvanse, please advise

## 2017-05-30 NOTE — Telephone Encounter (Signed)
Will start on Monday when I return.

## 2017-06-04 NOTE — Telephone Encounter (Signed)
Key: Antonio James

## 2017-06-07 NOTE — Telephone Encounter (Signed)
PA approved. Message sent to patient informing of same.

## 2017-06-11 MED FILL — VYVANSE 40 MG CAPSULE: 40 | 30 days supply | Qty: 30 | Fill #0

## 2017-08-19 MED FILL — VYVANSE 40 MG CAPSULE: 40 | 30 days supply | Qty: 30 | Fill #0

## 2017-08-23 ENCOUNTER — Encounter: Payer: Self-pay | Admitting: Internal Medicine

## 2017-09-18 MED FILL — VYVANSE 40 MG CAPSULE: 40 | 30 days supply | Qty: 30 | Fill #0

## 2017-10-08 ENCOUNTER — Telehealth (INDEPENDENT_AMBULATORY_CARE_PROVIDER_SITE_OTHER): Payer: Self-pay | Admitting: Orthopaedic Surgery

## 2017-10-08 MED ORDER — DICLOFENAC SODIUM 75 MG PO TBEC: 75.0000 mg | DELAYED_RELEASE_TABLET | Freq: Two times a day (BID) | ORAL | 0 refills | Status: DC

## 2017-10-08 MED FILL — DICLOFENAC SODIUM 75 MG TAB: 75 | 30 days supply | Qty: 60 | Fill #0

## 2017-10-08 NOTE — Telephone Encounter (Signed)
Patient called advised he spoke with Dr. Ophelia CharterYates on Sunday and was told to call to get Rx filled for (Diclofenac or Voltaren) Patient [facility]. The number to contact patient is [phone]

## 2017-10-08 NOTE — Telephone Encounter (Signed)
Call in  Generic voltaren 75mg  po bid # 60 thanks .

## 2017-10-08 NOTE — Telephone Encounter (Signed)
Please advise 

## 2017-10-08 NOTE — Telephone Encounter (Signed)
Sent to pharmacy 

## 2017-11-18 DIAGNOSIS — E291 Testicular hypofunction: Secondary | ICD-10-CM | POA: Diagnosis not present

## 2017-11-18 DIAGNOSIS — N529 Male erectile dysfunction, unspecified: Secondary | ICD-10-CM | POA: Diagnosis not present

## 2017-11-19 DIAGNOSIS — E291 Testicular hypofunction: Secondary | ICD-10-CM | POA: Diagnosis not present

## 2017-11-19 DIAGNOSIS — N529 Male erectile dysfunction, unspecified: Secondary | ICD-10-CM | POA: Diagnosis not present

## 2017-12-02 ENCOUNTER — Encounter: Payer: Self-pay | Admitting: Internal Medicine

## 2017-12-05 MED FILL — CLOMIPHENE CITRATE 50 MG TA: 50 | 30 days supply | Qty: 30 | Fill #0

## 2017-12-09 NOTE — Telephone Encounter (Signed)
Copied from CRM [id number]. Topic: Quick Communication - Rx Refill/Question >> Dec 09, 2017  4:38 PM Floria RavelingStovall, Shana A wrote: Medication: Vyvanse 40mg  and Clomid at 50 mg Has the patient contacted their pharmacy? No  (Agent: If no, request that the patient contact the pharmacy for the refill.) Preferred Pharmacy (with phone number or street name): [facility]  Agent: Please be advised that RX refills may take up to 3 business days. We ask that you follow-up with your pharmacy.

## 2017-12-11 ENCOUNTER — Other Ambulatory Visit: Payer: Self-pay | Admitting: Internal Medicine

## 2017-12-11 DIAGNOSIS — E291 Testicular hypofunction: Secondary | ICD-10-CM

## 2017-12-11 DIAGNOSIS — F909 Attention-deficit hyperactivity disorder, unspecified type: Secondary | ICD-10-CM

## 2017-12-11 MED ORDER — CLOMIPHENE CITRATE 50 MG PO TABS: ORAL_TABLET | ORAL | 1 refills | Status: DC

## 2017-12-11 MED ORDER — LISDEXAMFETAMINE DIMESYLATE 40 MG PO CAPS: 40.0000 mg | ORAL_CAPSULE | ORAL | 0 refills | Status: DC

## 2017-12-11 MED FILL — VYVANSE 40 MG CAPSULE: 40 | 30 days supply | Qty: 30 | Fill #0

## 2017-12-11 NOTE — Telephone Encounter (Signed)
Pt called in to check on his med refill -. [facility] needs [facility].   Sending high priority as this has been requested on the 1st.

## 2017-12-18 DIAGNOSIS — E291 Testicular hypofunction: Secondary | ICD-10-CM | POA: Diagnosis not present

## 2018-01-29 ENCOUNTER — Other Ambulatory Visit: Payer: Self-pay | Admitting: Internal Medicine

## 2018-01-29 DIAGNOSIS — F909 Attention-deficit hyperactivity disorder, unspecified type: Secondary | ICD-10-CM

## 2018-01-29 MED ORDER — LISDEXAMFETAMINE DIMESYLATE 40 MG PO CAPS: 40.0000 mg | ORAL_CAPSULE | ORAL | 0 refills | Status: DC

## 2018-01-29 MED FILL — VYVANSE 40 MG CAPSULE: 40 | 90 days supply | Qty: 90 | Fill #0

## 2018-01-29 NOTE — Telephone Encounter (Signed)
Appointment scheduled for first available with Dr Yetta Barre.

## 2018-01-29 NOTE — Telephone Encounter (Signed)
Pt has scheduled an appt for 02/19/2018.  Okay to refill Vyvanse

## 2018-01-29 NOTE — Telephone Encounter (Signed)
Pt is due for an appt. Can you get him scheduled?

## 2018-02-19 ENCOUNTER — Other Ambulatory Visit (INDEPENDENT_AMBULATORY_CARE_PROVIDER_SITE_OTHER): Payer: 59

## 2018-02-19 ENCOUNTER — Encounter: Payer: Self-pay | Admitting: Internal Medicine

## 2018-02-19 ENCOUNTER — Ambulatory Visit: Payer: 59 | Admitting: Internal Medicine

## 2018-02-19 VITALS — BP 128/74 | HR 73 | Temp 98.1°F | Ht 75.0 in | Wt 232.5 lb

## 2018-02-19 DIAGNOSIS — K219 Gastro-esophageal reflux disease without esophagitis: Secondary | ICD-10-CM

## 2018-02-19 DIAGNOSIS — E291 Testicular hypofunction: Secondary | ICD-10-CM | POA: Diagnosis not present

## 2018-02-19 DIAGNOSIS — F909 Attention-deficit hyperactivity disorder, unspecified type: Secondary | ICD-10-CM | POA: Diagnosis not present

## 2018-02-19 LAB — COMPREHENSIVE METABOLIC PANEL
ALT: 27 U/L (ref 0–53)
AST: 28 U/L (ref 0–37)
Albumin: 4.2 g/dL (ref 3.5–5.2)
Alkaline Phosphatase: 59 U/L (ref 39–117)
BUN: 26 mg/dL — ABNORMAL HIGH (ref 6–23)
CALCIUM: 9.3 mg/dL (ref 8.4–10.5)
CHLORIDE: 101 meq/L (ref 96–112)
CO2: 31 mEq/L (ref 19–32)
Creatinine, Ser: 1.24 mg/dL (ref 0.40–1.50)
GFR: 71.5 mL/min (ref >60.00)
Glucose, Bld: 83 mg/dL (ref 70–99)
POTASSIUM: 4.1 meq/L (ref 3.5–5.1)
Sodium: 139 mEq/L (ref 135–145)
Total Bilirubin: 0.6 mg/dL (ref 0.2–1.2)
Total Protein: 6.9 g/dL (ref 6.0–8.3)

## 2018-02-19 LAB — CBC WITH DIFFERENTIAL/PLATELET
BASOS PCT: 0.5 % (ref 0.0–3.0)
Basophils Absolute: 0 10*3/uL (ref 0.0–0.1)
EOS ABS: 0.1 10*3/uL (ref 0.0–0.7)
EOS PCT: 1.3 % (ref 0.0–5.0)
HEMATOCRIT: 47.6 % (ref 39.0–52.0)
HEMOGLOBIN: 16.3 g/dL (ref 13.0–17.0)
LYMPHS PCT: 36.9 % (ref 12.0–46.0)
Lymphs Abs: 1.8 10*3/uL (ref 0.7–4.0)
MCHC: 34.3 g/dL (ref 30.0–36.0)
MCV: 87.4 fl (ref 78.0–100.0)
MONOS PCT: 7.5 % (ref 3.0–12.0)
Monocytes Absolute: 0.4 10*3/uL (ref 0.1–1.0)
Neutro Abs: 2.6 10*3/uL (ref 1.4–7.7)
Neutrophils Relative %: 53.8 % (ref 43.0–77.0)
Platelets: 160 10*3/uL (ref 150.0–400.0)
RBC: 5.44 Mil/uL (ref 4.22–5.81)
RDW: 14.2 % (ref 11.5–15.5)
WBC: 4.8 10*3/uL (ref 4.0–10.5)

## 2018-02-19 NOTE — Patient Instructions (Signed)

## 2018-02-19 NOTE — Progress Notes (Signed)
Subjective:  Patient ID: Antonio James, male    DOB: 01/15/1986  Age: 32 y.o. MRN: [id number]  CC: Gastroesophageal Reflux and ADHD   HPI Antonio James presents for f/up.  Pt states ADD status overall stable on current meds with overall good compliance and tolerability, and good effectiveness with respect to ability for concentration and task completion.  Outpatient Medications Prior to Visit  Medication Sig Dispense Refill  . cholecalciferol (VITAMIN D) 1000 UNITS tablet Take 1,000 Units by mouth daily.    . clomiPHENE (CLOMID) 50 MG tablet One po QD 90 tablet 1  . lisdexamfetamine (VYVANSE) 40 MG capsule Take 1 capsule (40 mg total) by mouth every morning. 90 capsule 0  . diclofenac (VOLTAREN) 75 MG EC tablet Take 1 tablet (75 mg total) by mouth 2 (two) times daily. 60 tablet 0  . pantoprazole (PROTONIX) 40 MG tablet Take 1 tablet (40 mg total) by mouth daily. 30 tablet 6   No facility-administered medications prior to visit.     ROS Review of Systems  Constitutional: Negative.  Negative for activity change, appetite change, diaphoresis, fatigue and unexpected weight change.  HENT: Negative.   Eyes: Negative for visual disturbance.  Respiratory: Negative for chest tightness and shortness of breath.   Cardiovascular: Negative for chest pain, palpitations and leg swelling.  Gastrointestinal: Negative for abdominal pain, constipation, diarrhea, nausea and vomiting.  Endocrine: Negative.   Genitourinary: Negative.  Negative for penile swelling, scrotal swelling, testicular pain and urgency.  Musculoskeletal: Negative.  Negative for arthralgias, back pain, myalgias and neck pain.  Skin: Negative.  Negative for color change and rash.  Neurological: Negative.   Hematological: Negative for adenopathy. Does not bruise/bleed easily.  Psychiatric/Behavioral: Positive for decreased concentration. Negative for behavioral problems, dysphoric mood, self-injury, sleep disturbance and  suicidal ideas. The patient is not nervous/anxious and is not hyperactive.     Objective:  BP 128/74 (BP Location: Left Arm, Patient Position: Sitting, Cuff Size: Large)   Pulse 73   Temp 98.1 F (36.7 C) (Oral)   Ht 6\' 3"  (1.905 m)   Wt 232 lb 8 oz (105.5 kg)   SpO2 95%   BMI 29.06 kg/m   BP Readings from Last 3 Encounters:  02/19/18 128/74  04/04/17 120/80  06/07/16 106/68    Wt Readings from Last 3 Encounters:  02/19/18 232 lb 8 oz (105.5 kg)  04/04/17 223 lb (101.2 kg)  06/07/16 220 lb (99.8 kg)    Physical Exam  Constitutional: He is oriented to person, place, and time. No distress.  HENT:  Mouth/Throat: Oropharynx is clear and moist. No oropharyngeal exudate.  Eyes: Conjunctivae are normal. No scleral icterus.  Neck: Normal range of motion. No JVD present. No thyromegaly present.  Cardiovascular: Normal rate, regular rhythm and normal heart sounds.  No murmur heard. Pulmonary/Chest: Effort normal and breath sounds normal. No respiratory distress. He has no wheezes. He has no rales.  Abdominal: Soft. Normal appearance and bowel sounds are normal. He exhibits no mass. There is no hepatosplenomegaly. There is no tenderness.  Musculoskeletal: Normal range of motion. He exhibits no edema, tenderness or deformity.  Lymphadenopathy:    He has no cervical adenopathy.  Neurological: He is alert and oriented to person, place, and time.  Skin: Skin is warm and dry. No rash noted. He is not diaphoretic.  Psychiatric: He has a normal mood and affect. His behavior is normal. Judgment and thought content normal.  Vitals reviewed.   Lab Results  Component Value Date   WBC 4.8 02/19/2018   HGB 16.3 02/19/2018   HCT 47.6 02/19/2018   PLT 160.0 02/19/2018   GLUCOSE 83 02/19/2018   CHOL 148 06/07/2016   TRIG 60.0 06/07/2016   HDL 51.30 06/07/2016   LDLCALC 85 06/07/2016   ALT 27 02/19/2018   AST 28 02/19/2018   NA 139 02/19/2018   K 4.1 02/19/2018   CL 101 02/19/2018    CREATININE 1.24 02/19/2018   BUN 26 (H) 02/19/2018   CO2 31 02/19/2018   TSH 1.67 06/07/2016    Mr Shoulder Left W Contrast  Result Date: 11/30/2014 CLINICAL DATA:  Left shoulder pain with exercise for approximately 6 months. No specific injury or prior relevant surgery. Initial encounter. EXAM: MR ARTHROGRAM OF THE LEFT SHOULDER TECHNIQUE: Multiplanar, multisequence MR imaging of the left shoulder was performed following the administration of intra-articular contrast. CONTRAST:  See Injection Documentation. COMPARISON:  Left shoulder MR arthrogram 06/06/2006. Injection image today. FINDINGS: Glenohumeral Joint: Adequately distended with contrast.No extra-articular contrast extension demonstrated.No chondral defect or loose body observed. Labrum: No evidence of labral tear.The glenohumeral ligaments appear intact. Biceps long head:  Intact and normally positioned. Acromioclavicular Joint: The acromion is type 1. There are mild acromioclavicular degenerative changes. No significant fluid is present in the subacromial - subdeltoid bursa. Rotator cuff: Intact without significant tendinosis. Mild heterogeneity within the superior subscapularis recess attributed to the injection. Muscles:  No focal muscular atrophy or edema. Bones:  No significant extra-articular osseous findings. IMPRESSION: No acute findings or significant changes. The rotator cuff and labrum appear intact. Electronically Signed   By: Carey Bullocks M.D.   On: 11/30/2014 16:54   Dg Fluoro Guide Ndl Plc/bx  Result Date: 11/30/2014 CLINICAL DATA:  Left shoulder pain. FLUOROSCOPY TIME:  Radiation Exposure Index (as provided by the fluoroscopic device): 40.68 microGray*m2 Fluoroscopy Time (in minutes and seconds):  0 minutes 18 seconds PROCEDURE: LEFT SHOULDER INJECTION UNDER FLUOROSCOPY An appropriate skin entrance site was determined. The site was marked, prepped with Betadine, draped in the usual sterile fashion, and infiltrated locally  with buffered Lidocaine. 22 gauge spinal needle was advanced to the superomedial margin of the humeral head under intermittent fluoroscopy. 1 ml of Lidocaine injected easily. A mixture of 0.1 mL of Multihance, 5 mL of 1% lidocaine, and 15 mL of Omnipaque 180 was then used to opacify the left shoulder capsule. No immediate complication. IMPRESSION: Technically successful left shoulder injection for MRI. Electronically Signed   By: Sebastian Ache   On: 11/30/2014 15:37    Assessment & Plan:   Demonta was seen today for gastroesophageal reflux and adhd.  Diagnoses and all orders for this visit:  Gastroesophageal reflux disease without esophagitis- His symptoms are well controlled with the PPI.  No complications noted. -     CBC with Differential/Platelet; Future  Hypogonadism male- His testosterone level is in the normal range and there is no evidence of erythrocytosis or hepatic injury.  Will continue the current dose of clomiphene. -     CBC with Differential/Platelet; Future -     Comprehensive metabolic panel; Future -     Testosterone Total,Free,Bio, Males; Future   Adult ADHD- He is doing well on the current dose of Vyvanse.  Will continue.   I have discontinued Osmel C. Frigon's pantoprazole and diclofenac. I am also having him maintain his cholecalciferol, clomiPHENE, and lisdexamfetamine.  No orders of the defined types were placed in this encounter.    Follow-up: Return in  about 6 months (around 08/21/2018).  Sanda Lingerhomas Saamir Armstrong, MD

## 2018-02-21 ENCOUNTER — Encounter: Payer: Self-pay | Admitting: Internal Medicine

## 2018-02-21 LAB — TESTOSTERONE TOTAL,FREE,BIO, MALES
Albumin: 4.6 g/dL (ref 3.6–5.1)
Sex Hormone Binding: 63 nmol/L — ABNORMAL HIGH (ref 10–50)
TESTOSTERONE FREE: 33.9 pg/mL — AB (ref 46.0–224.0)
TESTOSTERONE: 454 ng/dL (ref 250–827)
Testosterone, Bioavailable: 71.3 ng/dL — ABNORMAL LOW (ref >=110.0)

## 2018-03-12 DIAGNOSIS — E291 Testicular hypofunction: Secondary | ICD-10-CM | POA: Diagnosis not present

## 2018-03-14 ENCOUNTER — Ambulatory Visit: Payer: Self-pay | Admitting: Psychology

## 2018-03-14 DIAGNOSIS — N5201 Erectile dysfunction due to arterial insufficiency: Secondary | ICD-10-CM | POA: Diagnosis not present

## 2018-03-14 DIAGNOSIS — E291 Testicular hypofunction: Secondary | ICD-10-CM | POA: Diagnosis not present

## 2018-03-14 MED FILL — SILDENAFIL 20 MG TABLET: 20 | 15 days supply | Qty: 30 | Fill #0

## 2018-04-09 DIAGNOSIS — Z3141 Encounter for fertility testing: Secondary | ICD-10-CM | POA: Diagnosis not present

## 2018-04-17 DIAGNOSIS — L03119 Cellulitis of unspecified part of limb: Secondary | ICD-10-CM | POA: Diagnosis not present

## 2018-04-17 DIAGNOSIS — R030 Elevated blood-pressure reading, without diagnosis of hypertension: Secondary | ICD-10-CM | POA: Diagnosis not present

## 2018-04-18 ENCOUNTER — Encounter (HOSPITAL_COMMUNITY): Payer: Self-pay | Admitting: Emergency Medicine

## 2018-04-18 ENCOUNTER — Emergency Department (HOSPITAL_COMMUNITY)
Admission: EM | Admit: 2018-04-18 | Discharge: 2018-04-18 | Disposition: A | Discharge status: Home / Self Care | Payer: 59 | Attending: Emergency Medicine | Admitting: Emergency Medicine

## 2018-04-18 DIAGNOSIS — F909 Attention-deficit hyperactivity disorder, unspecified type: Secondary | ICD-10-CM | POA: Insufficient documentation

## 2018-04-18 DIAGNOSIS — L539 Erythematous condition, unspecified: Secondary | ICD-10-CM | POA: Diagnosis not present

## 2018-04-18 DIAGNOSIS — Z79899 Other long term (current) drug therapy: Secondary | ICD-10-CM | POA: Diagnosis not present

## 2018-04-18 DIAGNOSIS — L03115 Cellulitis of right lower limb: Secondary | ICD-10-CM | POA: Insufficient documentation

## 2018-04-18 DIAGNOSIS — F329 Major depressive disorder, single episode, unspecified: Secondary | ICD-10-CM | POA: Diagnosis not present

## 2018-04-18 DIAGNOSIS — Z87891 Personal history of nicotine dependence: Secondary | ICD-10-CM | POA: Insufficient documentation

## 2018-04-18 DIAGNOSIS — R2241 Localized swelling, mass and lump, right lower limb: Secondary | ICD-10-CM | POA: Diagnosis present

## 2018-04-18 DIAGNOSIS — R509 Fever, unspecified: Secondary | ICD-10-CM | POA: Diagnosis not present

## 2018-04-18 LAB — CBC WITH DIFFERENTIAL/PLATELET
Basophils Absolute: 0 10*3/uL (ref 0.0–0.1)
Basophils Relative: 1 %
EOS ABS: 0.1 10*3/uL (ref 0.0–0.7)
EOS PCT: 1 %
HCT: 44.7 % (ref 39.0–52.0)
Hemoglobin: 15.4 g/dL (ref 13.0–17.0)
Lymphocytes Relative: 24 %
Lymphs Abs: 1.4 10*3/uL (ref 0.7–4.0)
MCH: 30.1 pg (ref 26.0–34.0)
MCHC: 34.5 g/dL (ref 30.0–36.0)
MCV: 87.5 fL (ref 78.0–100.0)
Monocytes Absolute: 0.8 10*3/uL (ref 0.1–1.0)
Monocytes Relative: 15 %
Neutro Abs: 3.4 10*3/uL (ref 1.7–7.7)
Neutrophils Relative %: 59 %
PLATELETS: 164 10*3/uL (ref 150–400)
RBC: 5.11 MIL/uL (ref 4.22–5.81)
RDW: 13.5 % (ref 11.5–15.5)
WBC: 5.7 10*3/uL (ref 4.0–10.5)

## 2018-04-18 LAB — CK: Total CK: 208 U/L (ref 49–397)

## 2018-04-18 LAB — I-STAT CG4 LACTIC ACID, ED: LACTIC ACID, VENOUS: 1.06 mmol/L (ref 0.5–1.9)

## 2018-04-18 MED ORDER — CLINDAMYCIN PHOSPHATE 600 MG/50ML IV SOLN: 600.0000 mg | Freq: Once | INTRAVENOUS | Status: DC

## 2018-04-18 MED ORDER — CLINDAMYCIN HCL 300 MG PO CAPS: 300.0000 mg | ORAL_CAPSULE | Freq: Three times a day (TID) | ORAL | 0 refills | Status: AC

## 2018-04-18 NOTE — ED Provider Notes (Signed)
[facility]-EMERGENCY DEPT Provider Note   CSN: [id number] Arrival date & time: 04/18/18  1421     History   Chief Complaint Chief Complaint  Patient presents with  . leg infection    HPI Antonio James is a 32 y.o. male who presents to the ED for wound infection. Patient reports getting an injection in his right quad 3 days ago and since then has had redness swelling and fever. Patient went to UC yesterday and was started on Keflex.   HPI  Past Medical History:  Diagnosis Date  . Adult ADHD   . Depression    counseling throughout his life, no meds  . GERD (gastroesophageal reflux disease)   . Left hip pain    Intermittent, has known torn labrum in left hip  . Panic attacks   . Situational anxiety     Patient Active Problem List   Diagnosis Date Noted  . Routine general medical examination at a health care facility 02/16/2015  . Hypogonadism male 02/16/2015  . Adult ADHD 07/15/2014  . GERD (gastroesophageal reflux disease) 01/21/2014    Past Surgical History:  Procedure Laterality Date  . WISDOM TOOTH EXTRACTION     No complications        Home Medications    Prior to Admission medications   Medication Sig Start Date End Date Taking? Authorizing Provider  cephALEXin (KEFLEX) 500 MG capsule Take 500 mg by mouth 4 (four) times daily.   Yes [provider]  cholecalciferol (VITAMIN D) 1000 UNITS tablet Take 1,000 Units by mouth daily.   Yes [provider]  multivitamin (ONE-A-DAY MEN'S) TABS tablet Take 1 tablet by mouth daily.   Yes [provider]  clindamycin (CLEOCIN) 300 MG capsule Take 1 capsule (300 mg total) by mouth 3 (three) times daily for 10 days. 04/18/18 04/28/18  Janne NapoleonNeese, Hope M, NP    Family History Family History  Problem Relation Age of Onset  . Cancer Mother        breast  . Cancer Maternal Grandmother   . Heart disease Maternal Grandfather   . Stroke Maternal Grandfather   . Diabetes  Paternal Grandmother   . Heart attack Paternal Grandfather   . Other Paternal Grandfather        hypogonadism    Social History Social History   Tobacco Use  . Smoking status: Former Smoker    Types: E-cigarettes  . Smokeless tobacco: Former Engineer, waterUser  Substance Use Topics  . Alcohol use: Yes  . Drug use: No     Allergies   Sulfa antibiotics   Review of Systems Review of Systems  Constitutional: Positive for fever.  Skin: Positive for color change and wound.  All other systems reviewed and are negative.    Physical Exam Updated Vital Signs BP 129/82   Pulse 66   Temp 98.1 F (36.7 C)   Resp 15   SpO2 98%   Physical Exam  Constitutional: He appears well-developed and well-nourished. No distress.  HENT:  Head: Normocephalic and atraumatic.  Eyes: EOM are normal.  Neck: Neck supple.  Cardiovascular: Normal rate.  Pulmonary/Chest: Effort normal.  Musculoskeletal: Normal range of motion.  Neurological: He is alert.  Skin:  Right thigh with area of erythema approximately 10 cm. There is increased warmth and the area is tender.   Nursing note and vitals reviewed.    ED Treatments / Results  Labs (all labs ordered are listed, but only abnormal results are displayed) Labs  Reviewed  CBC WITH DIFFERENTIAL/PLATELET  CK  I-STAT CG4 LACTIC ACID, ED  I-STAT CG4 LACTIC ACID, ED    Radiology No results found.  Procedures Procedures (including critical care time)  Medications Ordered in ED Medications - No data to display   Initial Impression / Assessment and Plan / ED Course  I have reviewed the triage vital signs and the nursing notes. Dr. Silverio LayYao in to do ultrasound of the area for possible abscess. No abscess identified. Patient stable for d/c with normal labs. Discussed with the patient to continue the Keflex for the next 48 hours. If symptoms are not improving will stop Keflex and start Clindamycin. If symptoms are worsening over the next 24 to 48 hours or  other problems arise patient will return to the ED for further evaluation. Patient agrees with plan.  Final Clinical Impressions(s) / ED Diagnoses   Final diagnoses:  Cellulitis of right thigh    ED Discharge Orders         Ordered    clindamycin (CLEOCIN) 300 MG capsule  3 times daily     04/18/18 1606           Damian Leavelleese, RutlandHope M, NP 04/18/18 2248    Charlynne PanderYao, David Hsienta, MD 04/19/18 [phone]

## 2018-04-18 NOTE — ED Triage Notes (Signed)
Pt reports on Tuesday he got injection in right quad of Testerone. Since had redness, swelling and fever. Went to UC yesterday and was started on Keflex.

## 2018-04-18 NOTE — ED Notes (Signed)
PA at bedside.

## 2018-04-18 NOTE — Discharge Instructions (Signed)
If the area of redness increases over the next 24 hours, start the Clindamycin. If the redness improves continue the Keflex. Return here as needed for worsening symptoms.

## 2018-04-19 ENCOUNTER — Encounter (HOSPITAL_COMMUNITY): Payer: Self-pay | Admitting: *Deleted

## 2018-04-19 ENCOUNTER — Emergency Department (HOSPITAL_COMMUNITY)
Admission: EM | Admit: 2018-04-19 | Discharge: 2018-04-19 | Disposition: A | Discharge status: Home / Self Care | Payer: 59 | Attending: Emergency Medicine | Admitting: Emergency Medicine

## 2018-04-19 DIAGNOSIS — F17200 Nicotine dependence, unspecified, uncomplicated: Secondary | ICD-10-CM | POA: Diagnosis not present

## 2018-04-19 DIAGNOSIS — Z79899 Other long term (current) drug therapy: Secondary | ICD-10-CM | POA: Insufficient documentation

## 2018-04-19 DIAGNOSIS — L03115 Cellulitis of right lower limb: Secondary | ICD-10-CM | POA: Insufficient documentation

## 2018-04-19 DIAGNOSIS — M79651 Pain in right thigh: Secondary | ICD-10-CM | POA: Diagnosis present

## 2018-04-19 LAB — CBC WITH DIFFERENTIAL/PLATELET
Basophils Absolute: 0 K/uL (ref 0.0–0.1)
Basophils Relative: 1 %
Eosinophils Absolute: 0.1 K/uL (ref 0.0–0.7)
Eosinophils Relative: 2 %
HCT: 44.3 % (ref 39.0–52.0)
Hemoglobin: 15.4 g/dL (ref 13.0–17.0)
Lymphocytes Relative: 36 %
Lymphs Abs: 2.1 K/uL (ref 0.7–4.0)
MCH: 30.3 pg (ref 26.0–34.0)
MCHC: 34.8 g/dL (ref 30.0–36.0)
MCV: 87.2 fL (ref 78.0–100.0)
Monocytes Absolute: 0.5 K/uL (ref 0.1–1.0)
Monocytes Relative: 8 %
Neutro Abs: 3.2 K/uL (ref 1.7–7.7)
Neutrophils Relative %: 53 %
Platelets: 171 K/uL (ref 150–400)
RBC: 5.08 MIL/uL (ref 4.22–5.81)
RDW: 13.4 % (ref 11.5–15.5)
WBC: 5.9 K/uL (ref 4.0–10.5)

## 2018-04-19 LAB — BASIC METABOLIC PANEL WITH GFR
Anion gap: 11 (ref 5–15)
BUN: 21 mg/dL — ABNORMAL HIGH (ref 6–20)
CO2: 26 mmol/L (ref 22–32)
Calcium: 8.9 mg/dL (ref 8.9–10.3)
Chloride: 105 mmol/L (ref 98–111)
Creatinine, Ser: 1 mg/dL (ref 0.61–1.24)
GFR calc Af Amer: >60 mL/min (ref >60)
GFR calc non Af Amer: >60 mL/min (ref >60)
Glucose, Bld: 89 mg/dL (ref 70–99)
Potassium: 4 mmol/L (ref 3.5–5.1)
Sodium: 142 mmol/L (ref 135–145)

## 2018-04-19 LAB — I-STAT CG4 LACTIC ACID, ED: Lactic Acid, Venous: 0.78 mmol/L (ref 0.5–1.9)

## 2018-04-19 MED ORDER — TRAMADOL HCL 50 MG PO TABS: 50.0000 mg | ORAL_TABLET | Freq: Two times a day (BID) | ORAL | 0 refills | Status: DC | PRN

## 2018-04-19 MED ORDER — CLINDAMYCIN PHOSPHATE 600 MG/50ML IV SOLN
600.0000 mg | Freq: Once | INTRAVENOUS | Status: AC
  Administered 2018-04-19: 600 mg via INTRAVENOUS
  Filled 2018-04-19: qty 50

## 2018-04-19 MED ORDER — SODIUM CHLORIDE 0.9 % IV BOLUS
1000.0000 mL | Freq: Once | INTRAVENOUS | Status: AC
  Administered 2018-04-19: 1000 mL via INTRAVENOUS

## 2018-04-19 NOTE — Discharge Instructions (Signed)
As we discussed, stop taking the Keflex and start taking the clindamycin.  Apply ice to the area to help with swelling.  Elevate the leg.  Follow-up with primary care doctor in the next 2 to 4 days for further evaluation.  You can take Tylenol or Ibuprofen as directed for pain. You can alternate Tylenol and Ibuprofen every 4 hours. If you take Tylenol at 1pm, then you can take Ibuprofen at 5pm. Then you can take Tylenol again at 9pm.  He can take pain medication for severe breakthrough pain.  Return the emergency department for any worsening redness or swelling, fevers greater than 100.4, worsening pain, streaking of the redness down her leg or any other worsening or concerning symptoms.

## 2018-04-19 NOTE — ED Provider Notes (Signed)
[facility]-EMERGENCY DEPT Provider Note   CSN: [id number] Arrival date & time: 04/19/18  1200     History   Chief Complaint Chief Complaint  Patient presents with  . Cellulitis    HPI Antonio MarinerRobert C James is a 32 y.o. male past medical history of ADHD, depression, GERD who presents for evaluation of redness, pain, swelling to the anterior aspect of the right thigh that is been ongoing for the last 5 days.  Patient reports that 2 days prior to onset of symptoms, he had a testosterone injection proximally to the site.  Patient reports that 2 days later, he started developing some pain, redness, swelling to the anterior aspect of the mid right thigh.  Patient reports that he had some subjective fevers initially at onset.  He went to urgent care on 04/17/2018 for evaluation of symptoms.  At that time, he was prescribed Keflex which he had started.  Patient came to the ED yesterday for evaluation of worsening symptoms.  Bedside ultrasound showed no evidence of abscess.  Patient was told to continue taking the Keflex but if he did not have improvement, he was given a prescription for clindamycin and was told to switch.  Patient states that he has continue taking the Keflex but states he has not had any improvement.  He has not started clindamycin.  He states that the area has began spreading outside of the marked lines.  Patient reports that the area is more painful and hurts particularly when he moves.  Patient denies any chest pain, difficulty breathing, numbness/weakness.  He denies any history of diabetes, HIV.  The history is provided by the patient.    Past Medical History:  Diagnosis Date  . Adult ADHD   . Depression    counseling throughout his life, no meds  . GERD (gastroesophageal reflux disease)   . Left hip pain    Intermittent, has known torn labrum in left hip  . Panic attacks   . Situational anxiety     Patient Active Problem List   Diagnosis Date Noted  .  Routine general medical examination at a health care facility 02/16/2015  . Hypogonadism male 02/16/2015  . Adult ADHD 07/15/2014  . GERD (gastroesophageal reflux disease) 01/21/2014    Past Surgical History:  Procedure Laterality Date  . WISDOM TOOTH EXTRACTION     No complications        Home Medications    Prior to Admission medications   Medication Sig Start Date End Date Taking? Authorizing Provider  cephALEXin (KEFLEX) 500 MG capsule Take 500 mg by mouth 4 (four) times daily.   Yes [provider]  cholecalciferol (VITAMIN D) 1000 UNITS tablet Take 1,000 Units by mouth daily.   Yes [provider]  multivitamin (ONE-A-DAY MEN'S) TABS tablet Take 1 tablet by mouth daily.   Yes [provider]  clindamycin (CLEOCIN) 300 MG capsule Take 1 capsule (300 mg total) by mouth 3 (three) times daily for 10 days. 04/18/18 04/28/18  Janne NapoleonNeese, Hope M, NP  traMADol (ULTRAM) 50 MG tablet Take 1 tablet (50 mg total) by mouth every 12 (twelve) hours as needed. 04/19/18   Maxwell CaulLayden, Lindsey A, PA-C    Family History Family History  Problem Relation Age of Onset  . Cancer Mother        breast  . Cancer Maternal Grandmother   . Heart disease Maternal Grandfather   . Stroke Maternal Grandfather   . Diabetes Paternal Grandmother   . Heart  attack Paternal Grandfather   . Other Paternal Grandfather        hypogonadism    Social History Social History   Tobacco Use  . Smoking status: Former Smoker    Types: E-cigarettes  . Smokeless tobacco: Former Engineer, waterUser  Substance Use Topics  . Alcohol use: Yes  . Drug use: No     Allergies   Sulfa antibiotics   Review of Systems Review of Systems  Constitutional: Positive for fever (Subjective).  Skin: Positive for color change and wound.  All other systems reviewed and are negative.    Physical Exam Updated Vital Signs BP (!) 137/94   Pulse 63   Temp 98 F (36.7 C) (Oral)   Resp 20   SpO2 99%   Physical Exam    Constitutional: He is oriented to person, place, and time. He appears well-developed and well-nourished.  HENT:  Head: Normocephalic and atraumatic.  Mouth/Throat: Oropharynx is clear and moist and mucous membranes are normal.  Eyes: Pupils are equal, round, and reactive to light. Conjunctivae, EOM and lids are normal.  Neck: Full passive range of motion without pain.  Cardiovascular: Normal rate, regular rhythm, normal heart sounds and normal pulses. Exam reveals no gallop and no friction rub.  No murmur heard. Pulmonary/Chest: Effort normal and breath sounds normal.  Lungs clear to auscultation bilaterally.  Symmetric chest rise.  No wheezing, rales, rhonchi.  Abdominal: Soft. Normal appearance. There is no tenderness. There is no rigidity and no guarding.  Musculoskeletal: Normal range of motion.  Neurological: He is alert and oriented to person, place, and time.  Skin: Skin is warm and dry. Capillary refill takes less than 2 seconds.  12 x 10 cm area of warmth and erythema noted to the anterior aspect of the right thigh.  No streaking.  No fluctuance noted.  Psychiatric: He has a normal mood and affect. His speech is normal.  Nursing note and vitals reviewed.       ED Treatments / Results  Labs (all labs ordered are listed, but only abnormal results are displayed) Labs Reviewed  BASIC METABOLIC PANEL - Abnormal; Notable for the following components:      Result Value   BUN 21 (*)    All other components within normal limits  CBC WITH DIFFERENTIAL/PLATELET  I-STAT CG4 LACTIC ACID, ED    EKG None  Radiology No results found.  Procedures Procedures (including critical care time)  Medications Ordered in ED Medications  clindamycin (CLEOCIN) IVPB 600 mg (0 mg Intravenous Stopped 04/19/18 1702)  sodium chloride 0.9 % bolus 1,000 mL (1,000 mLs Intravenous New Bag/Given 04/19/18 1633)     Initial Impression / Assessment and Plan / ED Course  I have reviewed the triage  vital signs and the nursing notes.  Pertinent labs & imaging results that were available during my care of the patient were reviewed by me and considered in my medical decision making (see chart for details).     32 y.o. M who presents for evaluation of persistent right thigh warmth and erythema.  Seen by urgent care at onset of symptoms and was prescribed Keflex which he has been taking.  Seen here yesterday.  Bedside ultrasound showed no evidence of abscess.  He was given a prescription for clindamycin was told to transition to [place] he had no improvement in symptoms.  Patient reports he is continued having redness and swelling that is extended slightly around the area, because he repeats ED visit today.  Subjective fevers  at home but no measured temperature. Patient is afebrile, non-toxic appearing, sitting comfortably on examination table. Vital signs reviewed and stable.  On exam, he has a 12 x 10 cm area of warmth, erythema on the anterior aspect of the right thigh that extends slightly out of the marked borders that were placed yesterday.  No evidence of induration, fluctuance that would suggest abscess.  Suspect this is more failure in response to Keflex rather than outpatient antibiotic therapy failure.  Will plan to check basic labs.  Will give dose of IV clindamycin here in the ED.   BMP is unremarkable.  I-STAT lactic is negative.  CBC is without any significant leukocytosis, anemia.  Patient given a dose of IV antibiotics here in the ED.  Bedside ultrasound performed by myself which revealed no evidence of abscess.  Suspect this is more failure to Keflex rather than outpatient antibiotic therapy.  Will switch him to clindamycin.  Patient requesting something for pain that will help with sleep.  Patient reviewed on PMP.  No recent narcotic prescriptions.  We will plan a short course of tramadol for pain relief.  Encourage at home supportive therapy measures.  Instructed patient to follow-up with  his primary care doctor in the next 2 to 4 days for further evaluation. Patient had ample opportunity for questions and discussion. All patient's questions were answered with full understanding. Strict return precautions discussed. Patient expresses understanding and agreement to plan.   EMERGENCY DEPARTMENT US SOFT TISSUE INTERPRETATION "Study: Limited Soft Tissue Ultrasound"  INDICATIONS: Pain and Soft tissue infection Multiple views of the body part were obtained in real-time with a multi-frequency linear probe  PERFORMED BY: Myself IMAGES ARCHIVED?: No SIDE:Right  BODY PART:Lower extremity INTERPRETATION:  No abcess noted    Final Clinical Impressions(s) / ED Diagnoses   Final diagnoses:  Cellulitis of right leg    ED Discharge Orders         Ordered    traMADol (ULTRAM) 50 MG tablet  Every 12 hours PRN     04/19/18 1724           Maxwell Caul, PA-C 04/19/18 1738    Gerhard Munch, MD 04/19/18 2105

## 2018-04-19 NOTE — ED Triage Notes (Signed)
Pt states cellulitis is worsening. Pt was seen yesterday and prescribed keflex. Pt has redness on right upper anterior thigh that is spreading. Pain is worse with movement.

## 2018-04-24 ENCOUNTER — Ambulatory Visit: Payer: Self-pay | Admitting: Psychology

## 2018-06-02 DIAGNOSIS — N529 Male erectile dysfunction, unspecified: Secondary | ICD-10-CM | POA: Diagnosis not present

## 2018-06-02 DIAGNOSIS — E291 Testicular hypofunction: Secondary | ICD-10-CM | POA: Diagnosis not present

## 2018-06-12 DIAGNOSIS — Z72820 Sleep deprivation: Secondary | ICD-10-CM | POA: Diagnosis not present

## 2018-06-12 DIAGNOSIS — N529 Male erectile dysfunction, unspecified: Secondary | ICD-10-CM | POA: Diagnosis not present

## 2018-06-12 DIAGNOSIS — E291 Testicular hypofunction: Secondary | ICD-10-CM | POA: Diagnosis not present

## 2018-06-17 MED FILL — CLOMIPHENE CITRATE 50 MG TA: 50 | 90 days supply | Qty: 90 | Fill #0

## 2018-07-20 ENCOUNTER — Emergency Department (HOSPITAL_COMMUNITY): Payer: 59

## 2018-07-20 ENCOUNTER — Encounter (HOSPITAL_COMMUNITY): Payer: Self-pay

## 2018-07-20 ENCOUNTER — Emergency Department (HOSPITAL_COMMUNITY)
Admission: EM | Admit: 2018-07-20 | Discharge: 2018-07-20 | Disposition: A | Discharge status: Home / Self Care | Payer: 59 | Attending: Emergency Medicine | Admitting: Emergency Medicine

## 2018-07-20 ENCOUNTER — Other Ambulatory Visit: Payer: Self-pay

## 2018-07-20 DIAGNOSIS — R0789 Other chest pain: Secondary | ICD-10-CM | POA: Diagnosis not present

## 2018-07-20 DIAGNOSIS — Z87891 Personal history of nicotine dependence: Secondary | ICD-10-CM | POA: Diagnosis not present

## 2018-07-20 DIAGNOSIS — R1011 Right upper quadrant pain: Secondary | ICD-10-CM | POA: Insufficient documentation

## 2018-07-20 DIAGNOSIS — Z79899 Other long term (current) drug therapy: Secondary | ICD-10-CM | POA: Diagnosis not present

## 2018-07-20 LAB — URINALYSIS, ROUTINE W REFLEX MICROSCOPIC
Bacteria, UA: NONE SEEN
Bilirubin Urine: NEGATIVE
Glucose, UA: NEGATIVE mg/dL
Hgb urine dipstick: NEGATIVE
KETONES UR: NEGATIVE mg/dL
LEUKOCYTES UA: NEGATIVE
Nitrite: NEGATIVE
PROTEIN: NEGATIVE mg/dL
Specific Gravity, Urine: 1.014 (ref 1.005–1.030)
pH: 6 (ref 5.0–8.0)

## 2018-07-20 LAB — COMPREHENSIVE METABOLIC PANEL
ALBUMIN: 3.9 g/dL (ref 3.5–5.0)
ALK PHOS: 58 U/L (ref 38–126)
ALT: 32 U/L (ref 0–44)
AST: 29 U/L (ref 15–41)
Anion gap: 6 (ref 5–15)
BUN: 23 mg/dL — AB (ref 6–20)
CO2: 29 mmol/L (ref 22–32)
CREATININE: 1.11 mg/dL (ref 0.61–1.24)
Calcium: 8.9 mg/dL (ref 8.9–10.3)
Chloride: 105 mmol/L (ref 98–111)
GFR calc Af Amer: >60 mL/min (ref >60)
GFR calc non Af Amer: >60 mL/min (ref >60)
GLUCOSE: 95 mg/dL (ref 70–99)
Potassium: 4.4 mmol/L (ref 3.5–5.1)
Sodium: 140 mmol/L (ref 135–145)
Total Bilirubin: 0.6 mg/dL (ref 0.3–1.2)
Total Protein: 6.8 g/dL (ref 6.5–8.1)

## 2018-07-20 LAB — CBC
HCT: 52.5 % — ABNORMAL HIGH (ref 39.0–52.0)
HEMOGLOBIN: 17.9 g/dL — AB (ref 13.0–17.0)
MCH: 29.9 pg (ref 26.0–34.0)
MCHC: 34.1 g/dL (ref 30.0–36.0)
MCV: 87.8 fL (ref 80.0–100.0)
Platelets: 117 10*3/uL — ABNORMAL LOW (ref 150–400)
RBC: 5.98 MIL/uL — ABNORMAL HIGH (ref 4.22–5.81)
RDW: 12.2 % (ref 11.5–15.5)
WBC: 5.4 10*3/uL (ref 4.0–10.5)
nRBC: 0 % (ref 0.0–0.2)

## 2018-07-20 LAB — LIPASE, BLOOD: Lipase: 32 U/L (ref 11–51)

## 2018-07-20 MED ORDER — SODIUM CHLORIDE 0.9 % IV BOLUS
1000.0000 mL | Freq: Once | INTRAVENOUS | Status: AC
  Administered 2018-07-20: 1000 mL via INTRAVENOUS

## 2018-07-20 MED ORDER — NAPROXEN 375 MG PO TABS: 375.0000 mg | ORAL_TABLET | Freq: Two times a day (BID) | ORAL | 0 refills | Status: AC | PRN

## 2018-07-20 MED ORDER — METHOCARBAMOL 500 MG PO TABS: 500.0000 mg | ORAL_TABLET | Freq: Three times a day (TID) | ORAL | 0 refills | Status: DC | PRN

## 2018-07-20 NOTE — ED Triage Notes (Signed)
He c/o ruq abd. Discomfort radiating to right shoulder x 4 days.

## 2018-07-20 NOTE — ED Provider Notes (Signed)
[facility]-EMERGENCY DEPT Provider Note   CSN: [id number] Arrival date & time: 07/20/18  [id number]     History   Chief Complaint Chief Complaint  Patient presents with  . Abdominal Pain    HPI Kathe MarinerRobert C Worthing is a 32 y.o. male.  HPI 32 year old male here with right upper quadrant pain.  The patient states that for the last several days, he has had an intermittent sharp, positional, but also aching right upper quadrant pain.  Seems to be worse with certain position changes.  No alleviating factors.  He said no nausea or vomiting.  No diarrhea.  It does seem to be slightly worse with eating.  He denies any heavy alcohol or NSAID use.  No history of gallstones.  No history of previous cardiac disease.  No other medical complaints.  No epigastric or substernal chest pain.  No shortness of breath.  Past Medical History:  Diagnosis Date  . Adult ADHD   . Depression    counseling throughout his life, no meds  . GERD (gastroesophageal reflux disease)   . Left hip pain    Intermittent, has known torn labrum in left hip  . Panic attacks   . Situational anxiety     Patient Active Problem List   Diagnosis Date Noted  . Routine general medical examination at a health care facility 02/16/2015  . Hypogonadism male 02/16/2015  . Adult ADHD 07/15/2014  . GERD (gastroesophageal reflux disease) 01/21/2014    Past Surgical History:  Procedure Laterality Date  . WISDOM TOOTH EXTRACTION     No complications        Home Medications    Prior to Admission medications   Medication Sig Start Date End Date Taking? Authorizing Provider  cholecalciferol (VITAMIN D) 1000 UNITS tablet Take 1,000 Units by mouth daily.   Yes [provider]  clomiPHENE (CLOMID) 50 MG tablet Take 25 mg by mouth every other day.  06/17/18  Yes [provider]  IODINE, KELP, PO Take 1 tablet by mouth daily.   Yes [provider]  lisdexamfetamine (VYVANSE) 40 MG capsule  Take 40 mg by mouth as needed (work).   Yes [provider]  multivitamin (ONE-A-DAY MEN'S) TABS tablet Take 1 tablet by mouth daily.   Yes [provider]  Omega-3 Fatty Acids (FISH OIL PO) Take 15 mLs by mouth daily.   Yes [provider]  methocarbamol (ROBAXIN) 500 MG tablet Take 1 tablet (500 mg total) by mouth every 8 (eight) hours as needed for up to 21 doses for muscle spasms. 07/20/18   Shaune PollackIsaacs, Jalayna Josten, MD  naproxen (NAPROSYN) 375 MG tablet Take 1 tablet (375 mg total) by mouth 2 (two) times daily as needed for up to 7 days for moderate pain. 07/20/18 07/27/18  Shaune PollackIsaacs, Monesha Monreal, MD  traMADol (ULTRAM) 50 MG tablet Take 1 tablet (50 mg total) by mouth every 12 (twelve) hours as needed. Patient not taking: Reported on 07/20/2018 04/19/18   Maxwell CaulLayden, Lindsey A, PA-C    Family History Family History  Problem Relation Age of Onset  . Cancer Mother        breast  . Cancer Maternal Grandmother   . Heart disease Maternal Grandfather   . Stroke Maternal Grandfather   . Diabetes Paternal Grandmother   . Heart attack Paternal Grandfather   . Other Paternal Grandfather        hypogonadism    Social History Social History   Tobacco Use  . Smoking status:  Former Smoker    Types: E-cigarettes  . Smokeless tobacco: Former Engineer, water Use Topics  . Alcohol use: Yes  . Drug use: No     Allergies   Sulfa antibiotics   Review of Systems Review of Systems  Constitutional: Negative for chills, fatigue and fever.  HENT: Negative for congestion and rhinorrhea.   Eyes: Negative for visual disturbance.  Respiratory: Negative for cough, shortness of breath and wheezing.   Cardiovascular: Negative for chest pain and leg swelling.  Gastrointestinal: Positive for abdominal pain. Negative for diarrhea, nausea and vomiting.  Genitourinary: Negative for dysuria and flank pain.  Musculoskeletal: Negative for neck pain and neck stiffness.  Skin: Negative for rash and  wound.  Allergic/Immunologic: Negative for immunocompromised state.  Neurological: Negative for syncope, weakness and headaches.  All other systems reviewed and are negative.    Physical Exam Updated Vital Signs BP 124/76   Pulse 62   Temp 98.4 F (36.9 C) (Oral)   SpO2 97%   Physical Exam  Constitutional: He is oriented to person, place, and time. He appears well-developed and well-nourished. No distress.  HENT:  Head: Normocephalic and atraumatic.  Eyes: Conjunctivae are normal.  Neck: Neck supple.  Cardiovascular: Normal rate, regular rhythm and normal heart sounds. Exam reveals no friction rub.  No murmur heard. Pulmonary/Chest: Effort normal and breath sounds normal. No respiratory distress. He has no wheezes. He has no rales.  Slight TTP over Right lower costal margin  Abdominal: He exhibits no distension.  Mild TTP RUQ, neg Murphy's  Musculoskeletal: He exhibits no edema.  Neurological: He is alert and oriented to person, place, and time. He exhibits normal muscle tone.  Skin: Skin is warm. Capillary refill takes less than 2 seconds.  Psychiatric: He has a normal mood and affect.  Nursing note and vitals reviewed.    ED Treatments / Results  Labs (all labs ordered are listed, but only abnormal results are displayed) Labs Reviewed  COMPREHENSIVE METABOLIC PANEL - Abnormal; Notable for the following components:      Result Value   BUN 23 (*)    All other components within normal limits  CBC - Abnormal; Notable for the following components:   RBC 5.98 (*)    Hemoglobin 17.9 (*)    HCT 52.5 (*)    Platelets 117 (*)    All other components within normal limits  LIPASE, BLOOD  URINALYSIS, ROUTINE W REFLEX MICROSCOPIC    EKG EKG Interpretation  Date/Time:  Sunday July 20 2018 10:10:29 EST Ventricular Rate:  59 PR Interval:    QRS Duration: 101 QT Interval:  402 QTC Calculation: 399 R Axis:   70 Text Interpretation:  Sinus rhythm ST elev, probable  normal early repol pattern No old tracing to compare Confirmed by Shaune Pollack [phone]) on 07/20/2018 10:48:14 AM   Radiology Dg Chest 2 View  Result Date: 07/20/2018 CLINICAL DATA:  Right chest pain EXAM: CHEST - 2 VIEW COMPARISON:  None. FINDINGS: Lungs are clear. No pleural effusion or pneumothorax. The heart is normal in size. Visualized osseous structures are within normal limits. IMPRESSION: Normal chest radiographs. Electronically Signed   By: Charline Bills M.D.   On: 07/20/2018 09:48   US Abdomen Limited Ruq  Result Date: 07/20/2018 CLINICAL DATA:  32 year old with right upper quadrant pain. EXAM: ULTRASOUND ABDOMEN LIMITED RIGHT UPPER QUADRANT COMPARISON:  None. FINDINGS: Gallbladder: No gallstones or wall thickening visualized. No sonographic Murphy sign noted by sonographer. Common bile duct: Diameter: 0.3 cm  Liver: No focal lesion identified. Within normal limits in parenchymal echogenicity. Portal vein is patent on color Doppler imaging with normal direction of blood flow towards the liver. IMPRESSION: Normal right upper quadrant ultrasound. Electronically Signed   By: Richarda Overlie M.D.   On: 07/20/2018 11:18    Procedures Procedures (including critical care time)  Medications Ordered in ED Medications  sodium chloride 0.9 % bolus 1,000 mL (0 mLs Intravenous Stopped 07/20/18 1146)     Initial Impression / Assessment and Plan / ED Course  I have reviewed the triage vital signs and the nursing notes.  Pertinent labs & imaging results that were available during my care of the patient were reviewed by me and considered in my medical decision making (see chart for details).     32 year old male here with right upper quadrant and right lower costal margin pain.  Seems to be reproducible and related to physical activities I suspect it could be musculoskeletal.  His EKG is normal with no risk factors for cardiac disease and I do not suspect ACS.  He is PERC negative and has no  signs of PE.  Lab work is very reassuring.  Normal LFTs and normal right upper quadrant ultrasound. No RLQ TTP or signs of appendicitis.  Will trial course of tx for MSK pain, with good return precautions. VSS. Pt updated and in agreement.  Final Clinical Impressions(s) / ED Diagnoses   Final diagnoses:  RUQ pain    ED Discharge Orders         Ordered    naproxen (NAPROSYN) 375 MG tablet  2 times daily PRN     07/20/18 1208    methocarbamol (ROBAXIN) 500 MG tablet  Every 8 hours PRN     07/20/18 1208           Shaune Pollack, MD 07/20/18 1936

## 2018-07-20 NOTE — Discharge Instructions (Addendum)
As we discussed, your lab work and imaging today was very reassuring.  There is no evidence of gallstone disease, heart disease, or lung problem.  This could be pain from a musculoskeletal cause.  I would recommend trialing the naproxen and muscle relaxant.  If taking the naproxen regularly, I would recommend an over-the-counter antacid such as Pepcid to help with possible indigestion.  Otherwise, if the symptoms persist or worsen or do not improve over the next week, follow-up with your doctor.

## 2018-07-20 NOTE — ED Notes (Signed)
US at bedside

## 2018-07-21 MED FILL — NAPROXEN 375 MG TABS: 375 | 7 days supply | Qty: 14 | Fill #0

## 2018-07-21 MED FILL — METHOCARBAMOL 500 MG TABS: 500 | 7 days supply | Qty: 21 | Fill #0

## 2018-08-04 ENCOUNTER — Ambulatory Visit: Payer: 59 | Admitting: Internal Medicine

## 2018-08-04 DIAGNOSIS — Z0289 Encounter for other administrative examinations: Secondary | ICD-10-CM

## 2018-08-28 ENCOUNTER — Other Ambulatory Visit: Payer: Self-pay | Admitting: Internal Medicine

## 2018-08-28 ENCOUNTER — Encounter: Payer: Self-pay | Admitting: Internal Medicine

## 2018-08-28 MED ORDER — LISDEXAMFETAMINE DIMESYLATE 40 MG PO CAPS: 40.0000 mg | ORAL_CAPSULE | ORAL | 0 refills | Status: DC | PRN

## 2018-08-28 NOTE — Telephone Encounter (Signed)
Copied from CRM [id number]. Topic: Quick Communication - Rx Refill/Question >> Aug 28, 2018  2:10 PM [place] wrote: Medication: lisdexamfetamine (VYVANSE) 40 MG capsule / Pt has appt scheduled for 09/23/18/ Would like to know if a refill can be given until then. Please advise. Stated he was out.  Has the patient contacted their pharmacy? Yes.   (Agent: If no, request that the patient contact the pharmacy for the refill.) (Agent: If yes, when and what did the pharmacy advise?)  Preferred Pharmacy (with phone number or street name): [facility] [place] [address] [phone] (Phone) [phone] (Fax)    Agent: Please be advised that RX refills may take up to 3 business days. We ask that you follow-up with your pharmacy.

## 2018-08-28 NOTE — Telephone Encounter (Signed)
Requested medication (s) are due for refill today: not specified  Requested medication (s) are on the active medication list  yes  Last refill: 07/20/18    Future visit scheduled yes 09/23/2018  Dr. Yetta BarreJones  Notes to clinic:not delegated  Requested Prescriptions  Pending Prescriptions Disp Refills   lisdexamfetamine (VYVANSE) 40 MG capsule      Sig: Take 1 capsule (40 mg total) by mouth as needed (work).     Not Delegated - Psychiatry:  Stimulants/ADHD Failed - 08/28/2018  2:21 PM      Failed - This refill cannot be delegated      Failed - Urine Drug Screen completed in last 360 days.      Failed - Valid encounter within last 3 months    Recent Outpatient Visits          6 months ago Gastroesophageal reflux disease without esophagitis   [facility] Jones, Thomas L, MD   1 year ago Adult ADHD   [facility] Jones, Thomas L, MD   2 years ago Routine general medical examination at a health care facility   [facility] Jones, Thomas L, MD   3 years ago Need for prophylactic vaccination and inoculation against influenza   [facility] [facility], Thomas L, MD   3 years ago Panic attacks   [facility] Jones, Thomas L, MD      Future Appointments            In 3 weeks Etta GrandchildJones, Thomas L, MD [facility]

## 2018-08-29 ENCOUNTER — Encounter: Payer: Self-pay | Admitting: Family

## 2018-09-01 ENCOUNTER — Ambulatory Visit: Payer: Self-pay | Admitting: *Deleted

## 2018-09-01 NOTE — Telephone Encounter (Signed)
Message from Gerrianne ScaleAngela L Payne sent at 09/01/2018 10:10 AM EST   pt calling stating that he had sent a message to provider he has an appt in January he states that his insurance denied his lisdexamfetamine (VYVANSE) 40 MG capsule again and that he is out of medicine since before Christmas he would like a call back he has tried other medicines and nothing helped

## 2018-09-02 NOTE — Telephone Encounter (Signed)
Patient called checking on this request (see other messages in chart). He has scheduled an appointment with Dr Yetta BarreJones.  Patient would like a call back with info on refill.

## 2018-09-04 ENCOUNTER — Telehealth: Payer: Self-pay | Admitting: Internal Medicine

## 2018-09-04 NOTE — Telephone Encounter (Signed)
I will do a PA for the Vyvanse.

## 2018-09-04 NOTE — Telephone Encounter (Signed)
Copied from CRM [id number]. Topic: Quick Communication - See Telephone Encounter >> Sep 04, 2018  2:46 PM Arlyss Gandy, NT wrote: CRM for notification. See Telephone encounter for: 09/04/18. Maryann with outpatient pharmacy needs the frequency for the lisdexamfetamine (VYVANSE) 40 MG capsule in order to fill for pt. Please advise.

## 2018-09-05 MED FILL — VYVANSE 40 MG CAPSULE: 40 | 30 days supply | Qty: 30 | Fill #0

## 2018-09-05 NOTE — Telephone Encounter (Signed)
Contacted pharmacy and informed rx sig is 1 tab po qd per Vernona Rieger

## 2018-09-11 DIAGNOSIS — Z3189 Encounter for other procreative management: Secondary | ICD-10-CM | POA: Diagnosis not present

## 2018-09-17 DIAGNOSIS — E291 Testicular hypofunction: Secondary | ICD-10-CM | POA: Diagnosis not present

## 2018-09-17 DIAGNOSIS — Z72820 Sleep deprivation: Secondary | ICD-10-CM | POA: Diagnosis not present

## 2018-09-17 DIAGNOSIS — N529 Male erectile dysfunction, unspecified: Secondary | ICD-10-CM | POA: Diagnosis not present

## 2018-09-19 DIAGNOSIS — E291 Testicular hypofunction: Secondary | ICD-10-CM | POA: Diagnosis not present

## 2018-09-19 DIAGNOSIS — D751 Secondary polycythemia: Secondary | ICD-10-CM | POA: Diagnosis not present

## 2018-09-19 DIAGNOSIS — Z72820 Sleep deprivation: Secondary | ICD-10-CM | POA: Diagnosis not present

## 2018-09-19 DIAGNOSIS — N529 Male erectile dysfunction, unspecified: Secondary | ICD-10-CM | POA: Diagnosis not present

## 2018-09-23 ENCOUNTER — Ambulatory Visit: Payer: Self-pay | Admitting: Internal Medicine

## 2018-10-02 ENCOUNTER — Ambulatory Visit: Payer: 59 | Admitting: Internal Medicine

## 2018-10-02 ENCOUNTER — Encounter: Payer: Self-pay | Admitting: Internal Medicine

## 2018-10-02 VITALS — BP 120/70 | HR 66 | Temp 97.9°F | Resp 16 | Ht 75.0 in | Wt 242.5 lb

## 2018-10-02 DIAGNOSIS — F909 Attention-deficit hyperactivity disorder, unspecified type: Secondary | ICD-10-CM

## 2018-10-02 DIAGNOSIS — Z23 Encounter for immunization: Secondary | ICD-10-CM | POA: Diagnosis not present

## 2018-10-02 MED ORDER — LISDEXAMFETAMINE DIMESYLATE 40 MG PO CAPS: 40.0000 mg | ORAL_CAPSULE | ORAL | 0 refills | Status: DC | PRN

## 2018-10-02 NOTE — Patient Instructions (Signed)

## 2018-10-02 NOTE — Progress Notes (Signed)
Subjective:  Patient ID: Antonio James, male    DOB: [birth year]  Age: 33 y.o. MRN: [id number]  CC: ADHD   HPI Antonio James presents for f/up -  Pt states ADD status overall stable on current meds with overall good compliance and tolerability, and good effectiveness with respect to ability for concentration and task completion.  Outpatient Medications Prior to Visit  Medication Sig Dispense Refill  . cholecalciferol (VITAMIN D) 1000 UNITS tablet Take 1,000 Units by mouth daily.    . clomiPHENE (CLOMID) 50 MG tablet Take 25 mg by mouth every other day.   1  . multivitamin (ONE-A-DAY MEN'S) TABS tablet Take 1 tablet by mouth daily.    . Omega-3 Fatty Acids (FISH OIL PO) Take 15 mLs by mouth daily.    . IODINE, KELP, PO Take 1 tablet by mouth daily.    Marland Kitchen lisdexamfetamine (VYVANSE) 40 MG capsule Take 1 capsule (40 mg total) by mouth as needed (work). 30 capsule 0  . methocarbamol (ROBAXIN) 500 MG tablet Take 1 tablet (500 mg total) by mouth every 8 (eight) hours as needed for up to 21 doses for muscle spasms. 21 tablet 0  . traMADol (ULTRAM) 50 MG tablet Take 1 tablet (50 mg total) by mouth every 12 (twelve) hours as needed. 8 tablet 0   No facility-administered medications prior to visit.     ROS Review of Systems  Constitutional: Negative.  Negative for unexpected weight change.  HENT: Negative.   Respiratory: Negative for chest tightness, shortness of breath and wheezing.   Cardiovascular: Negative for palpitations and leg swelling.  Gastrointestinal: Negative for abdominal pain, constipation, diarrhea and vomiting.  Genitourinary: Negative.   Musculoskeletal: Negative.   Skin: Negative.   Neurological: Negative.  Negative for dizziness, weakness and light-headedness.  Hematological: Negative for adenopathy. Does not bruise/bleed easily.  Psychiatric/Behavioral: Positive for decreased concentration. Negative for agitation, behavioral problems, confusion, dysphoric mood,  hallucinations, self-injury, sleep disturbance and suicidal ideas. The patient is not hyperactive.     Objective:  BP 120/70 (BP Location: Left Arm, Patient Position: Sitting, Cuff Size: Large)   Pulse 66   Temp 97.9 F (36.6 C) (Oral)   Resp 16   Ht 6\' 3"  (1.905 m)   Wt 242 lb 8 oz (110 kg)   SpO2 98%   BMI 30.31 kg/m   BP Readings from Last 3 Encounters:  10/02/18 120/70  07/20/18 124/74  04/19/18 118/68    Wt Readings from Last 3 Encounters:  10/02/18 242 lb 8 oz (110 kg)  02/19/18 232 lb 8 oz (105.5 kg)  04/04/17 223 lb (101.2 kg)    Physical Exam Vitals signs reviewed.  HENT:     Mouth/Throat:     Pharynx: Oropharynx is clear. No oropharyngeal exudate.  Eyes:     General: No scleral icterus.    Conjunctiva/sclera: Conjunctivae normal.  Neck:     Musculoskeletal: Normal range of motion and neck supple.  Cardiovascular:     Rate and Rhythm: Normal rate and regular rhythm.     Heart sounds: No murmur. No gallop.   Pulmonary:     Effort: Pulmonary effort is normal.     Breath sounds: No stridor. No wheezing, rhonchi or rales.  Abdominal:     General: Bowel sounds are normal.     Palpations: There is no mass.     Tenderness: There is no abdominal tenderness. There is no guarding.  Musculoskeletal: Normal range of motion.  General: No swelling.     Right lower leg: No edema.     Left lower leg: No edema.  Skin:    General: Skin is warm and dry.  Neurological:     General: No focal deficit present.     Mental Status: He is oriented to person, place, and time. Mental status is at baseline.  Psychiatric:        Mood and Affect: Mood normal.        Behavior: Behavior normal.        Thought Content: Thought content normal.        Judgment: Judgment normal.     Lab Results  Component Value Date   WBC 5.4 07/20/2018   HGB 17.9 (H) 07/20/2018   HCT 52.5 (H) 07/20/2018   PLT 117 (L) 07/20/2018   GLUCOSE 95 07/20/2018   CHOL 148 06/07/2016   TRIG  60.0 06/07/2016   HDL 51.30 06/07/2016   LDLCALC 85 06/07/2016   ALT 32 07/20/2018   AST 29 07/20/2018   NA 140 07/20/2018   K 4.4 07/20/2018   CL 105 07/20/2018   CREATININE 1.11 07/20/2018   BUN 23 (H) 07/20/2018   CO2 29 07/20/2018   TSH 1.67 06/07/2016    Dg Chest 2 View  Result Date: 07/20/2018 CLINICAL DATA:  Right chest pain EXAM: CHEST - 2 VIEW COMPARISON:  None. FINDINGS: Lungs are clear. No pleural effusion or pneumothorax. The heart is normal in size. Visualized osseous structures are within normal limits. IMPRESSION: Normal chest radiographs. Electronically Signed   By: Charline BillsSriyesh  Krishnan M.D.   On: 07/20/2018 09:48   [place] Abdomen Limited Ruq  Result Date: 07/20/2018 CLINICAL DATA:  33 year old with right upper quadrant pain. EXAM: ULTRASOUND ABDOMEN LIMITED RIGHT UPPER QUADRANT COMPARISON:  None. FINDINGS: Gallbladder: No gallstones or wall thickening visualized. No sonographic Murphy sign noted by sonographer. Common bile duct: Diameter: 0.3 cm Liver: No focal lesion identified. Within normal limits in parenchymal echogenicity. Portal vein is patent on color Doppler imaging with normal direction of blood flow towards the liver. IMPRESSION: Normal right upper quadrant ultrasound. Electronically Signed   By: Richarda OverlieAdam  Henn M.D.   On: 07/20/2018 11:18    Assessment & Plan:   Antonio MaduroRobert was seen today for adhd.  Diagnoses and all orders for this visit:  Adult ADHD- He is doing well on the current dose of Vyvanse.  Will continue. -     lisdexamfetamine (VYVANSE) 40 MG capsule; Take 1 capsule (40 mg total) by mouth as needed (work).  Other orders -     Flu Vaccine QUAD 36+ mos IM   I have discontinued Laiken C. Godley's traMADol, (IODINE, KELP, PO), and methocarbamol. I am also having him maintain his cholecalciferol, multivitamin, clomiPHENE, Omega-3 Fatty Acids (FISH OIL PO), and lisdexamfetamine.  Meds ordered this encounter  Medications  . lisdexamfetamine (VYVANSE) 40 MG  capsule    Sig: Take 1 capsule (40 mg total) by mouth as needed (work).    Dispense:  90 capsule    Refill:  0     Follow-up: Return in about 4 months (around 01/31/2019).  Sanda Lingerhomas Dionisios Ricci, MD

## 2018-11-17 MED FILL — VYVANSE 40 MG CAPSULE: 40 | 30 days supply | Qty: 30 | Fill #0

## 2018-12-08 ENCOUNTER — Telehealth (INDEPENDENT_AMBULATORY_CARE_PROVIDER_SITE_OTHER): Payer: Self-pay | Admitting: Radiology

## 2018-12-08 NOTE — Telephone Encounter (Signed)
I called patient and confirmed appointment for 12/09/2018 at 1pm.  Patient answered "No" to all COVID-19 screening questions.

## 2018-12-09 ENCOUNTER — Ambulatory Visit (INDEPENDENT_AMBULATORY_CARE_PROVIDER_SITE_OTHER): Payer: 59 | Admitting: Orthopaedic Surgery

## 2018-12-09 ENCOUNTER — Encounter (INDEPENDENT_AMBULATORY_CARE_PROVIDER_SITE_OTHER): Payer: Self-pay | Admitting: Orthopaedic Surgery

## 2018-12-09 ENCOUNTER — Other Ambulatory Visit: Payer: Self-pay

## 2018-12-09 VITALS — Ht 74.0 in | Wt 225.0 lb

## 2018-12-09 DIAGNOSIS — M7702 Medial epicondylitis, left elbow: Secondary | ICD-10-CM | POA: Insufficient documentation

## 2018-12-09 NOTE — Progress Notes (Signed)
Office Visit Note   Patient: Antonio James           Date of Birth: December 04, 1985           MRN: [id number] Visit Date: 12/09/2018              Requested by: Etta Grandchild, MD [address] [place], [place] [address] PCP: Etta Grandchild, MD   Assessment & Plan: Visit Diagnoses:  1. Medial epicondylitis of elbow, left     Plan: We discussed treatment options we will place him in a tennis elbow brace and apply the medial air splint over the medial epicondyle just distal over the tendon.  Proper use was discussed if he has persistent symptoms we will consider injection versus diagnostic MRI scan.  No elbow effusion collateral ligaments are stable full range of motion of the elbow.  Follow-Up Instructions: No follow-ups on file.   Orders:  No orders of the defined types were placed in this encounter.  No orders of the defined types were placed in this encounter.     Procedures: No procedures performed   Clinical Data: No additional findings.   Subjective: Chief Complaint  Patient presents with  . Left Elbow - Pain    HPI 33 year old male with left nondominant medial elbow pain times several weeks.  No numbness or tingling in his hand.  He has pain when he does biceps curls he is not been using any free weights and more than a month.  He has worked out regularly for many years.  He has some soreness in his neck and sometimes in his left shoulder which he relates to the [facility] disc problem that did not require any surgical intervention.  No gait disturbance.  Opposite arms not bothered him.  No rash or acute injury.  Review of Systems patient's attorney positive for ADHD and GERD.  History of lumbar spondylosis.  14 point review of systems otherwise negative.   Objective: Vital Signs: Ht 6\' 2"  (1.88 m)   Wt 225 lb (102.1 kg)   BMI 28.89 kg/m   Physical Exam Constitutional:      Appearance: He is well-developed.  HENT:     Head: Normocephalic and  atraumatic.  Eyes:     Pupils: Pupils are equal, round, and reactive to light.  Neck:     Thyroid: No thyromegaly.     Trachea: No tracheal deviation.  Cardiovascular:     Rate and Rhythm: Normal rate.  Pulmonary:     Effort: Pulmonary effort is normal.     Breath sounds: No wheezing.  Abdominal:     General: Bowel sounds are normal.     Palpations: Abdomen is soft.  Skin:    General: Skin is warm and dry.     Capillary Refill: Capillary refill takes less than 2 seconds.  Neurological:     Mental Status: He is alert and oriented to person, place, and time.  Psychiatric:        Behavior: Behavior normal.        Thought Content: Thought content normal.        Judgment: Judgment normal.     Ortho Exam patient has tenderness over the medial epicondyle.  Reflexes are 2+ good cervical range of motion no rash over exposed skin.  Ulnar nerve is stable without subluxation no thenar or hyperthenar atrophy.  With resisted wrist flexion finger flexion he has pain at the medial epicondyle.  Specialty Comments:  No specialty comments available.  Imaging: No results found.   PMFS History: Patient Active Problem List   Diagnosis Date Noted  . Routine general medical examination at a health care facility 02/16/2015  . Hypogonadism male 02/16/2015  . Adult ADHD 07/15/2014  . GERD (gastroesophageal reflux disease) 01/21/2014   Past Medical History:  Diagnosis Date  . Adult ADHD   . Depression    counseling throughout his life, no meds  . GERD (gastroesophageal reflux disease)   . Left hip pain    Intermittent, has known torn labrum in left hip  . Panic attacks   . Situational anxiety     Family History  Problem Relation Age of Onset  . Cancer Mother        breast  . Cancer Maternal Grandmother   . Heart disease Maternal Grandfather   . Stroke Maternal Grandfather   . Diabetes Paternal Grandmother   . Heart attack Paternal Grandfather   . Other Paternal Grandfather         hypogonadism    Past Surgical History:  Procedure Laterality Date  . WISDOM TOOTH EXTRACTION     No complications   Social History   Occupational History  . Not on file  Tobacco Use  . Smoking status: Former Smoker    Types: E-cigarettes  . Smokeless tobacco: Former Engineer, waterUser  Substance and Sexual Activity  . Alcohol use: Yes  . Drug use: No  . Sexual activity: Not on file

## 2018-12-12 DIAGNOSIS — M9901 Segmental and somatic dysfunction of cervical region: Secondary | ICD-10-CM | POA: Diagnosis not present

## 2018-12-12 DIAGNOSIS — M9902 Segmental and somatic dysfunction of thoracic region: Secondary | ICD-10-CM | POA: Diagnosis not present

## 2018-12-12 DIAGNOSIS — M7522 Bicipital tendinitis, left shoulder: Secondary | ICD-10-CM | POA: Diagnosis not present

## 2018-12-12 DIAGNOSIS — R51 Headache: Secondary | ICD-10-CM | POA: Diagnosis not present

## 2018-12-15 DIAGNOSIS — M9902 Segmental and somatic dysfunction of thoracic region: Secondary | ICD-10-CM | POA: Diagnosis not present

## 2018-12-15 DIAGNOSIS — M9901 Segmental and somatic dysfunction of cervical region: Secondary | ICD-10-CM | POA: Diagnosis not present

## 2018-12-15 DIAGNOSIS — R51 Headache: Secondary | ICD-10-CM | POA: Diagnosis not present

## 2018-12-15 DIAGNOSIS — M7522 Bicipital tendinitis, left shoulder: Secondary | ICD-10-CM | POA: Diagnosis not present

## 2018-12-17 DIAGNOSIS — E291 Testicular hypofunction: Secondary | ICD-10-CM | POA: Diagnosis not present

## 2018-12-19 DIAGNOSIS — M9902 Segmental and somatic dysfunction of thoracic region: Secondary | ICD-10-CM | POA: Diagnosis not present

## 2018-12-19 DIAGNOSIS — Z72820 Sleep deprivation: Secondary | ICD-10-CM | POA: Diagnosis not present

## 2018-12-19 DIAGNOSIS — M7522 Bicipital tendinitis, left shoulder: Secondary | ICD-10-CM | POA: Diagnosis not present

## 2018-12-19 DIAGNOSIS — E291 Testicular hypofunction: Secondary | ICD-10-CM | POA: Diagnosis not present

## 2018-12-19 DIAGNOSIS — R51 Headache: Secondary | ICD-10-CM | POA: Diagnosis not present

## 2018-12-19 DIAGNOSIS — M9901 Segmental and somatic dysfunction of cervical region: Secondary | ICD-10-CM | POA: Diagnosis not present

## 2018-12-19 DIAGNOSIS — D751 Secondary polycythemia: Secondary | ICD-10-CM | POA: Diagnosis not present

## 2018-12-27 IMAGING — CR DG CHEST 2V
2 series · 2 of 2 positions shown · non-contrast
Comparison: None.

CLINICAL DATA: Right chest pain

EXAM:
CHEST - 2 VIEW

[w chest pa]
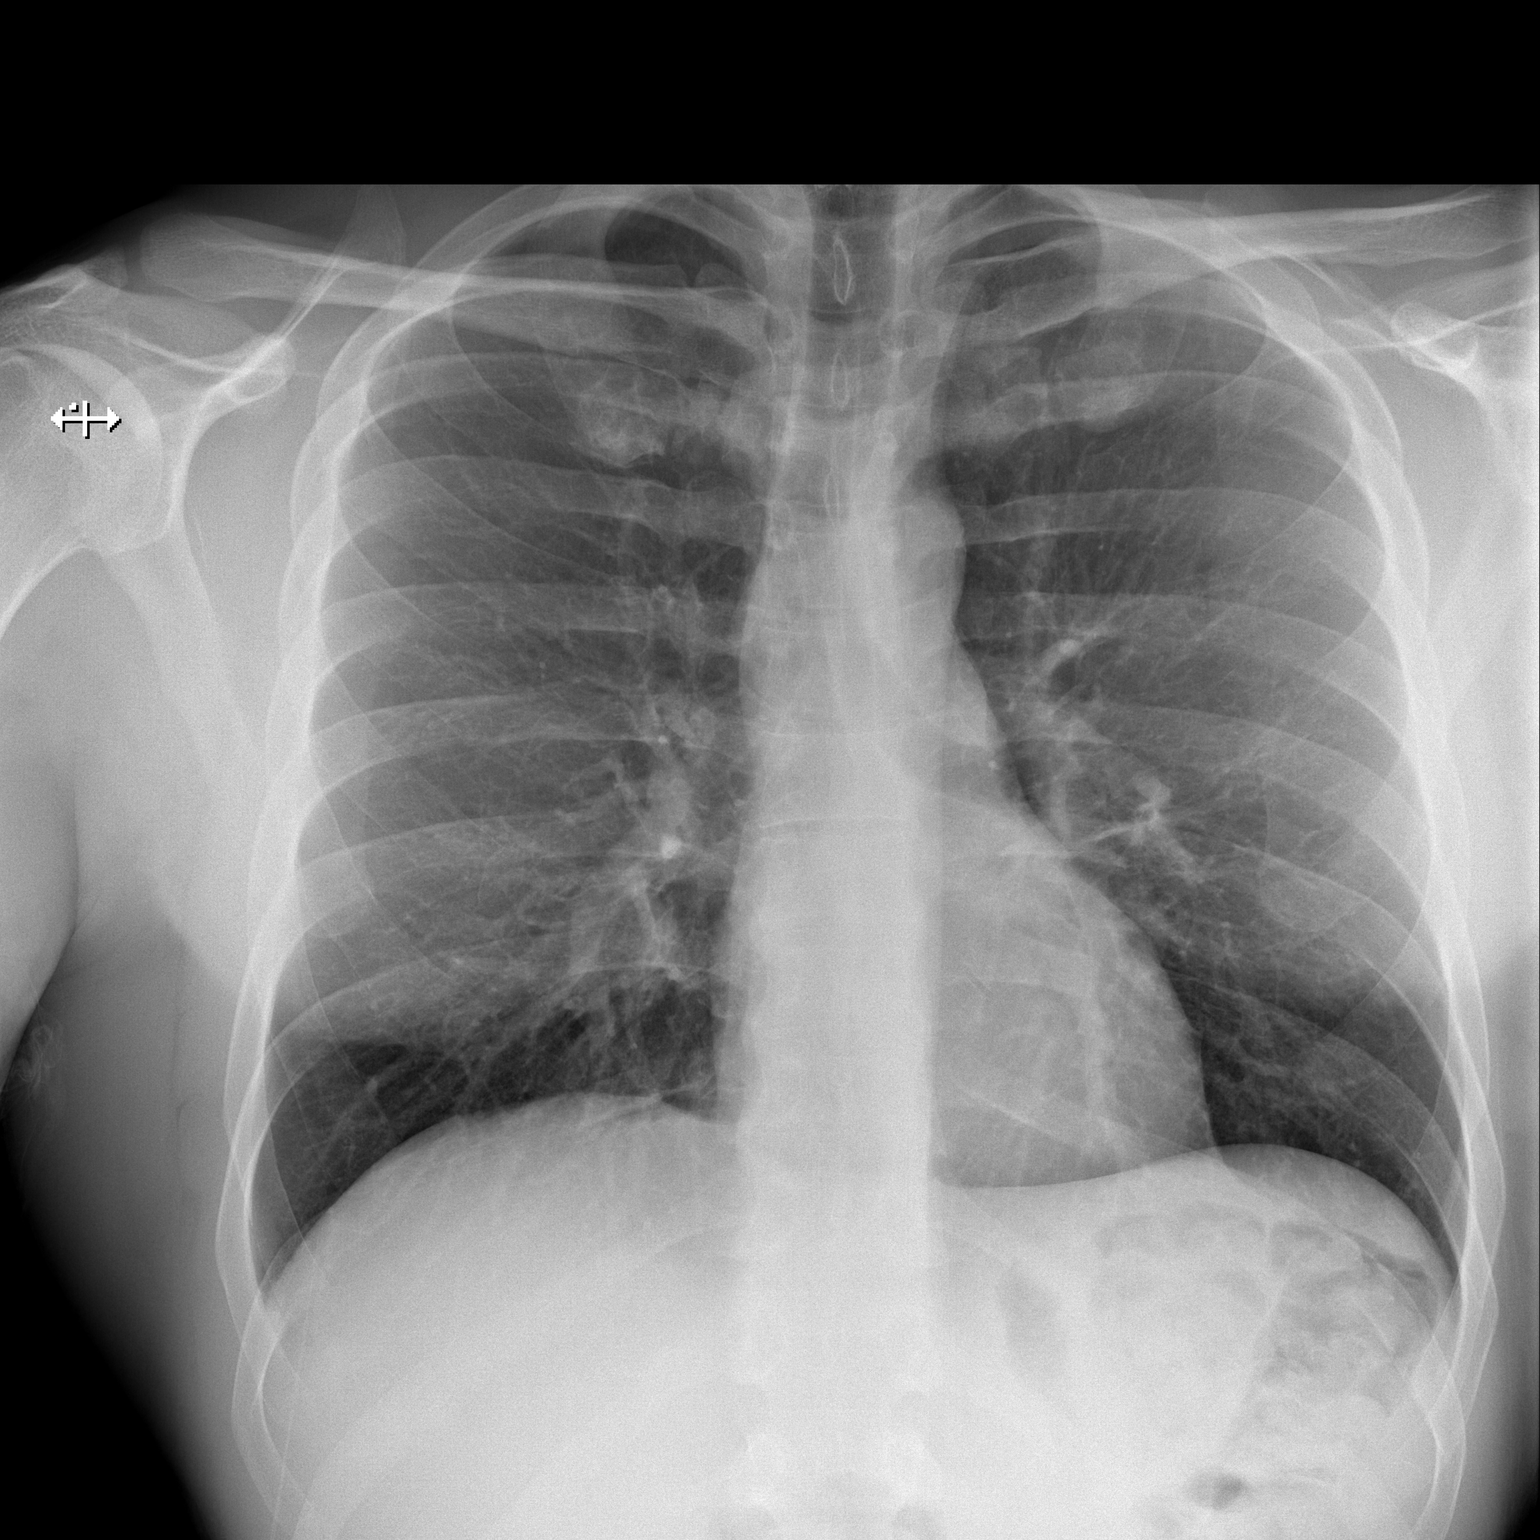

[w chest lat]
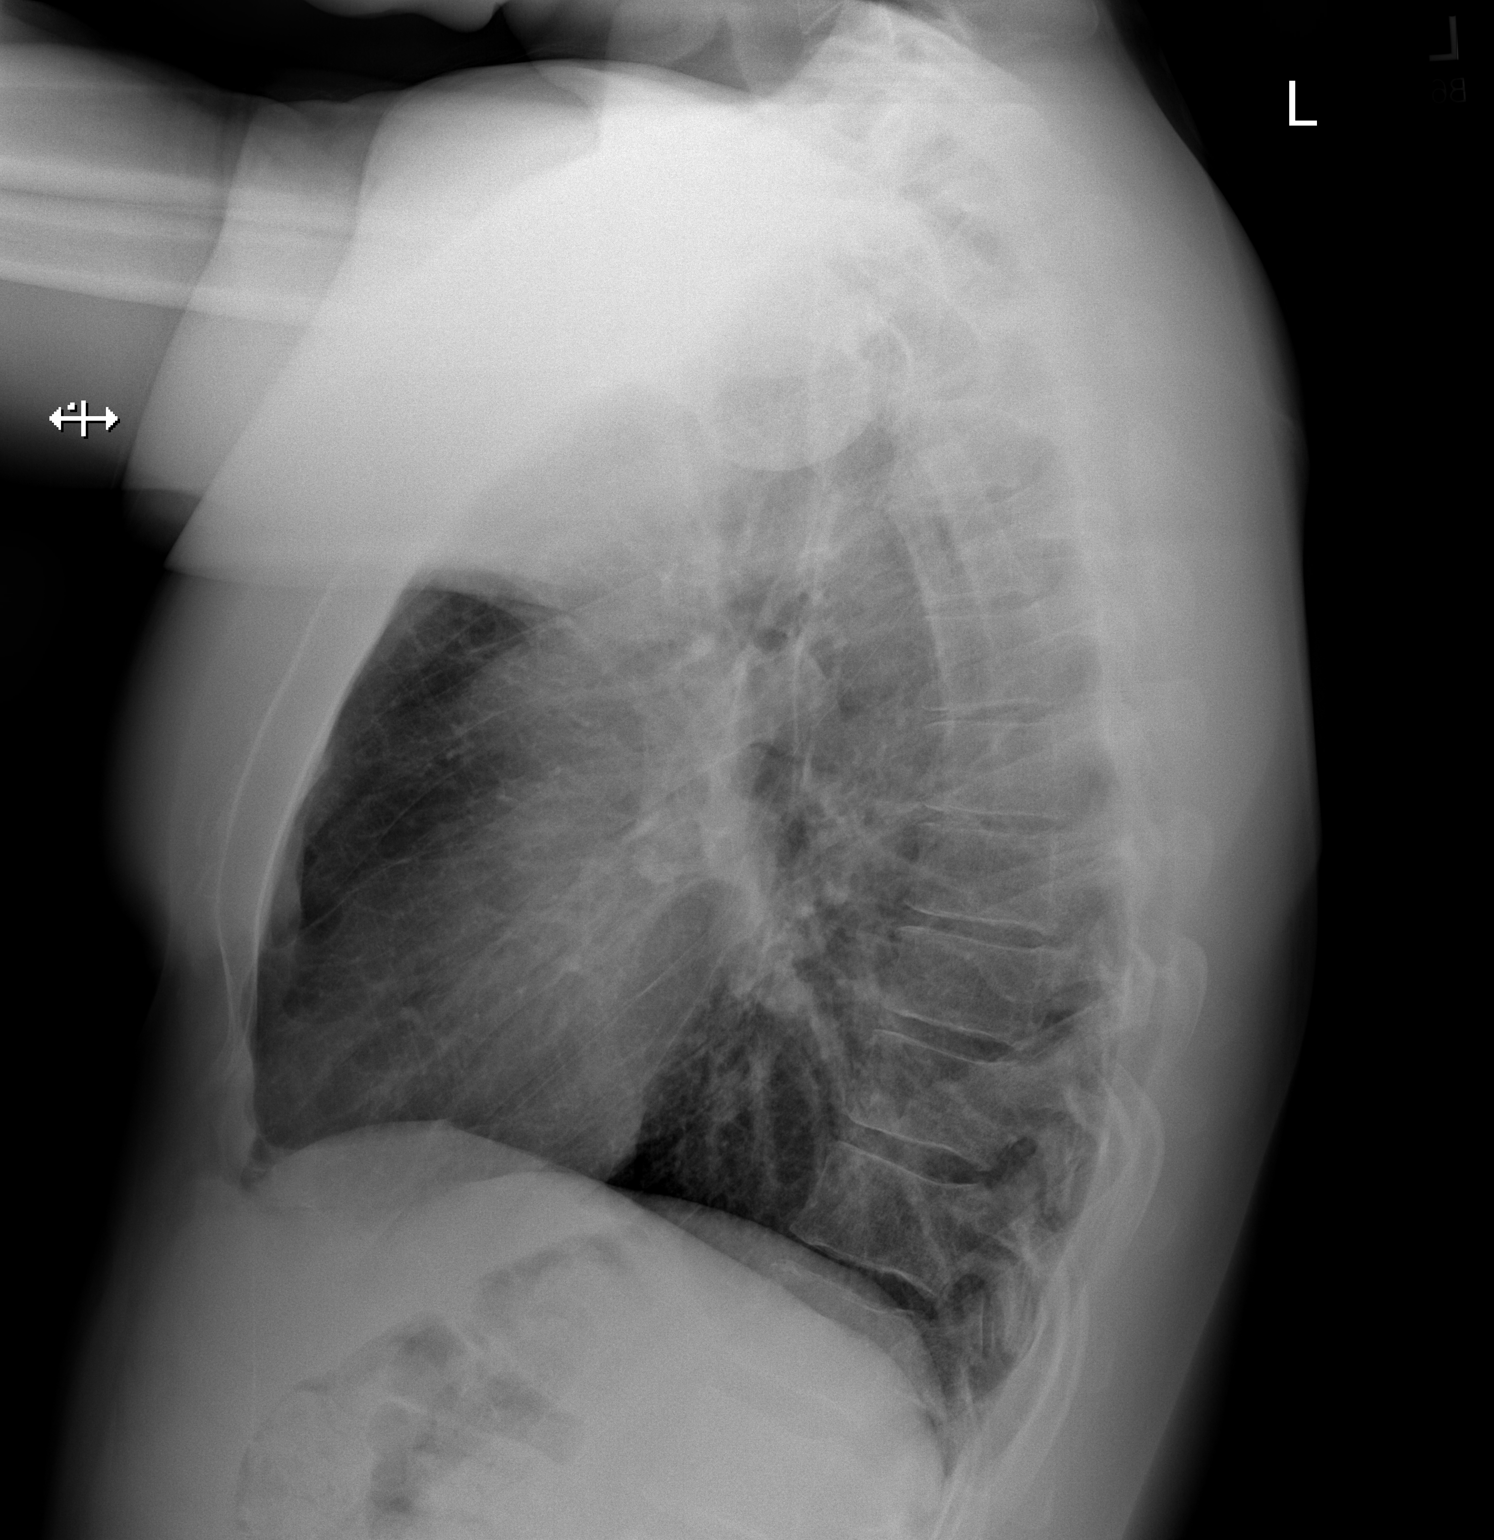

[2 of 2 positions shown; findings below may reference images not displayed]

FINDINGS: Lungs are clear. No pleural effusion or pneumothorax.

The heart is normal in size.

Visualized osseous structures are within normal limits.
IMPRESSION: Normal chest radiographs.

## 2019-01-07 ENCOUNTER — Other Ambulatory Visit: Payer: Self-pay | Admitting: Internal Medicine

## 2019-01-07 ENCOUNTER — Encounter: Payer: Self-pay | Admitting: Internal Medicine

## 2019-01-07 DIAGNOSIS — F909 Attention-deficit hyperactivity disorder, unspecified type: Secondary | ICD-10-CM

## 2019-01-07 MED ORDER — LISDEXAMFETAMINE DIMESYLATE 40 MG PO CAPS: 40.0000 mg | ORAL_CAPSULE | ORAL | 0 refills | Status: DC | PRN

## 2019-01-14 ENCOUNTER — Telehealth: Payer: Self-pay | Admitting: Internal Medicine

## 2019-01-14 DIAGNOSIS — F909 Attention-deficit hyperactivity disorder, unspecified type: Secondary | ICD-10-CM

## 2019-01-14 NOTE — Telephone Encounter (Signed)
Copied from CRM [id number]. Topic: Quick Communication - Rx Refill/Question >> Jan 14, 2019  1:46 PM Gwenlyn Fudge A wrote: Medication: lisdexamfetamine (VYVANSE) 40 MG capsule  Has the patient contacted their pharmacy? Yes. Pt states [facility] is closed and [facility] needs a new request sent in please advise.  (Agent: If no, request that the patient contact the pharmacy for the refill.) (Agent: If yes, when and what did the pharmacy advise?)  Preferred Pharmacy (with phone number or street name): [facility]  Agent: Please be advised that RX refills may take up to 3 business days. We ask that you follow-up with your pharmacy.

## 2019-01-14 NOTE — Telephone Encounter (Signed)
This rx was already done may 6 to [facility] per dr Yetta Barre

## 2019-01-15 ENCOUNTER — Encounter: Payer: Self-pay | Admitting: Internal Medicine

## 2019-01-15 DIAGNOSIS — F909 Attention-deficit hyperactivity disorder, unspecified type: Secondary | ICD-10-CM

## 2019-01-15 MED ORDER — LISDEXAMFETAMINE DIMESYLATE 40 MG PO CAPS: 40.0000 mg | ORAL_CAPSULE | ORAL | 0 refills | Status: DC | PRN

## 2019-01-15 MED FILL — VYVANSE 40 MG CAPSULE: 40 | 90 days supply | Qty: 90 | Fill #0

## 2019-01-20 DIAGNOSIS — Z3189 Encounter for other procreative management: Secondary | ICD-10-CM | POA: Diagnosis not present

## 2019-03-10 DIAGNOSIS — M9902 Segmental and somatic dysfunction of thoracic region: Secondary | ICD-10-CM | POA: Diagnosis not present

## 2019-03-10 DIAGNOSIS — M9901 Segmental and somatic dysfunction of cervical region: Secondary | ICD-10-CM | POA: Diagnosis not present

## 2019-03-10 DIAGNOSIS — M7522 Bicipital tendinitis, left shoulder: Secondary | ICD-10-CM | POA: Diagnosis not present

## 2019-03-10 DIAGNOSIS — R51 Headache: Secondary | ICD-10-CM | POA: Diagnosis not present

## 2019-03-18 DIAGNOSIS — E291 Testicular hypofunction: Secondary | ICD-10-CM | POA: Diagnosis not present

## 2019-03-25 DIAGNOSIS — E291 Testicular hypofunction: Secondary | ICD-10-CM | POA: Diagnosis not present

## 2019-03-25 DIAGNOSIS — N529 Male erectile dysfunction, unspecified: Secondary | ICD-10-CM | POA: Diagnosis not present

## 2019-03-25 DIAGNOSIS — Z72820 Sleep deprivation: Secondary | ICD-10-CM | POA: Diagnosis not present

## 2019-03-25 DIAGNOSIS — D750 Familial erythrocytosis: Secondary | ICD-10-CM | POA: Diagnosis not present

## 2019-05-14 DIAGNOSIS — M7522 Bicipital tendinitis, left shoulder: Secondary | ICD-10-CM | POA: Diagnosis not present

## 2019-05-14 DIAGNOSIS — M7918 Myalgia, other site: Secondary | ICD-10-CM | POA: Diagnosis not present

## 2019-05-14 DIAGNOSIS — M7542 Impingement syndrome of left shoulder: Secondary | ICD-10-CM | POA: Diagnosis not present

## 2019-05-14 DIAGNOSIS — M222X1 Patellofemoral disorders, right knee: Secondary | ICD-10-CM | POA: Diagnosis not present

## 2019-05-14 DIAGNOSIS — M25512 Pain in left shoulder: Secondary | ICD-10-CM | POA: Diagnosis not present

## 2019-05-14 DIAGNOSIS — M222X2 Patellofemoral disorders, left knee: Secondary | ICD-10-CM | POA: Diagnosis not present

## 2019-05-14 DIAGNOSIS — M25562 Pain in left knee: Secondary | ICD-10-CM | POA: Diagnosis not present

## 2019-05-14 DIAGNOSIS — M25561 Pain in right knee: Secondary | ICD-10-CM | POA: Diagnosis not present

## 2019-05-29 DIAGNOSIS — M25512 Pain in left shoulder: Secondary | ICD-10-CM | POA: Diagnosis not present

## 2019-05-29 DIAGNOSIS — M222X1 Patellofemoral disorders, right knee: Secondary | ICD-10-CM | POA: Diagnosis not present

## 2019-05-29 DIAGNOSIS — M7522 Bicipital tendinitis, left shoulder: Secondary | ICD-10-CM | POA: Diagnosis not present

## 2019-05-29 DIAGNOSIS — M25562 Pain in left knee: Secondary | ICD-10-CM | POA: Diagnosis not present

## 2019-05-29 DIAGNOSIS — M222X2 Patellofemoral disorders, left knee: Secondary | ICD-10-CM | POA: Diagnosis not present

## 2019-05-29 DIAGNOSIS — M25561 Pain in right knee: Secondary | ICD-10-CM | POA: Diagnosis not present

## 2019-05-29 DIAGNOSIS — M7542 Impingement syndrome of left shoulder: Secondary | ICD-10-CM | POA: Diagnosis not present

## 2019-05-29 DIAGNOSIS — M7918 Myalgia, other site: Secondary | ICD-10-CM | POA: Diagnosis not present

## 2019-06-05 DIAGNOSIS — M25562 Pain in left knee: Secondary | ICD-10-CM | POA: Diagnosis not present

## 2019-06-05 DIAGNOSIS — M7542 Impingement syndrome of left shoulder: Secondary | ICD-10-CM | POA: Diagnosis not present

## 2019-06-05 DIAGNOSIS — M7522 Bicipital tendinitis, left shoulder: Secondary | ICD-10-CM | POA: Diagnosis not present

## 2019-06-05 DIAGNOSIS — M7918 Myalgia, other site: Secondary | ICD-10-CM | POA: Diagnosis not present

## 2019-06-05 DIAGNOSIS — M222X1 Patellofemoral disorders, right knee: Secondary | ICD-10-CM | POA: Diagnosis not present

## 2019-06-05 DIAGNOSIS — M25561 Pain in right knee: Secondary | ICD-10-CM | POA: Diagnosis not present

## 2019-06-05 DIAGNOSIS — M25512 Pain in left shoulder: Secondary | ICD-10-CM | POA: Diagnosis not present

## 2019-06-05 DIAGNOSIS — M222X2 Patellofemoral disorders, left knee: Secondary | ICD-10-CM | POA: Diagnosis not present

## 2019-06-08 ENCOUNTER — Other Ambulatory Visit: Payer: Self-pay | Admitting: Internal Medicine

## 2019-06-08 DIAGNOSIS — F909 Attention-deficit hyperactivity disorder, unspecified type: Secondary | ICD-10-CM

## 2019-06-08 MED ORDER — LISDEXAMFETAMINE DIMESYLATE 40 MG PO CAPS: 40.0000 mg | ORAL_CAPSULE | ORAL | 0 refills | Status: DC | PRN

## 2019-06-08 MED FILL — VYVANSE 40 MG CAPSULE: 40 | 90 days supply | Qty: 90 | Fill #0

## 2019-06-08 NOTE — Telephone Encounter (Signed)
Per database, Last filled on 01/15/2019 for 90 day supply.

## 2019-06-19 DIAGNOSIS — M25562 Pain in left knee: Secondary | ICD-10-CM | POA: Diagnosis not present

## 2019-06-19 DIAGNOSIS — M222X2 Patellofemoral disorders, left knee: Secondary | ICD-10-CM | POA: Diagnosis not present

## 2019-06-19 DIAGNOSIS — M7918 Myalgia, other site: Secondary | ICD-10-CM | POA: Diagnosis not present

## 2019-06-19 DIAGNOSIS — M7522 Bicipital tendinitis, left shoulder: Secondary | ICD-10-CM | POA: Diagnosis not present

## 2019-06-19 DIAGNOSIS — M25512 Pain in left shoulder: Secondary | ICD-10-CM | POA: Diagnosis not present

## 2019-06-19 DIAGNOSIS — M7542 Impingement syndrome of left shoulder: Secondary | ICD-10-CM | POA: Diagnosis not present

## 2019-06-19 DIAGNOSIS — M25561 Pain in right knee: Secondary | ICD-10-CM | POA: Diagnosis not present

## 2019-06-19 DIAGNOSIS — M222X1 Patellofemoral disorders, right knee: Secondary | ICD-10-CM | POA: Diagnosis not present

## 2019-07-03 DIAGNOSIS — N529 Male erectile dysfunction, unspecified: Secondary | ICD-10-CM | POA: Diagnosis not present

## 2019-07-03 DIAGNOSIS — Z72820 Sleep deprivation: Secondary | ICD-10-CM | POA: Diagnosis not present

## 2019-07-03 DIAGNOSIS — E291 Testicular hypofunction: Secondary | ICD-10-CM | POA: Diagnosis not present

## 2019-07-08 DIAGNOSIS — D751 Secondary polycythemia: Secondary | ICD-10-CM | POA: Diagnosis not present

## 2019-07-08 DIAGNOSIS — N529 Male erectile dysfunction, unspecified: Secondary | ICD-10-CM | POA: Diagnosis not present

## 2019-07-08 DIAGNOSIS — Z72820 Sleep deprivation: Secondary | ICD-10-CM | POA: Diagnosis not present

## 2019-07-08 DIAGNOSIS — E291 Testicular hypofunction: Secondary | ICD-10-CM | POA: Diagnosis not present

## 2019-07-10 DIAGNOSIS — M7542 Impingement syndrome of left shoulder: Secondary | ICD-10-CM | POA: Diagnosis not present

## 2019-07-10 DIAGNOSIS — M7522 Bicipital tendinitis, left shoulder: Secondary | ICD-10-CM | POA: Diagnosis not present

## 2019-07-10 DIAGNOSIS — M222X2 Patellofemoral disorders, left knee: Secondary | ICD-10-CM | POA: Diagnosis not present

## 2019-07-10 DIAGNOSIS — M25562 Pain in left knee: Secondary | ICD-10-CM | POA: Diagnosis not present

## 2019-07-10 DIAGNOSIS — M25561 Pain in right knee: Secondary | ICD-10-CM | POA: Diagnosis not present

## 2019-07-10 DIAGNOSIS — M25512 Pain in left shoulder: Secondary | ICD-10-CM | POA: Diagnosis not present

## 2019-07-10 DIAGNOSIS — M7918 Myalgia, other site: Secondary | ICD-10-CM | POA: Diagnosis not present

## 2019-07-10 DIAGNOSIS — M222X1 Patellofemoral disorders, right knee: Secondary | ICD-10-CM | POA: Diagnosis not present

## 2019-07-16 DIAGNOSIS — Z20828 Contact with and (suspected) exposure to other viral communicable diseases: Secondary | ICD-10-CM | POA: Diagnosis not present

## 2019-07-16 DIAGNOSIS — Z03818 Encounter for observation for suspected exposure to other biological agents ruled out: Secondary | ICD-10-CM | POA: Diagnosis not present

## 2019-07-20 DIAGNOSIS — Z20828 Contact with and (suspected) exposure to other viral communicable diseases: Secondary | ICD-10-CM | POA: Diagnosis not present

## 2019-07-25 DIAGNOSIS — Z20828 Contact with and (suspected) exposure to other viral communicable diseases: Secondary | ICD-10-CM | POA: Diagnosis not present

## 2019-08-13 DIAGNOSIS — M25512 Pain in left shoulder: Secondary | ICD-10-CM | POA: Diagnosis not present

## 2019-08-13 DIAGNOSIS — M25522 Pain in left elbow: Secondary | ICD-10-CM | POA: Diagnosis not present

## 2019-08-21 DIAGNOSIS — M25512 Pain in left shoulder: Secondary | ICD-10-CM | POA: Diagnosis not present

## 2019-08-21 DIAGNOSIS — M25522 Pain in left elbow: Secondary | ICD-10-CM | POA: Diagnosis not present

## 2019-08-25 DIAGNOSIS — M25522 Pain in left elbow: Secondary | ICD-10-CM | POA: Diagnosis not present

## 2019-08-25 DIAGNOSIS — M25512 Pain in left shoulder: Secondary | ICD-10-CM | POA: Diagnosis not present

## 2019-09-11 DIAGNOSIS — M25512 Pain in left shoulder: Secondary | ICD-10-CM | POA: Diagnosis not present

## 2019-09-11 DIAGNOSIS — M25522 Pain in left elbow: Secondary | ICD-10-CM | POA: Diagnosis not present

## 2019-09-23 DIAGNOSIS — M25522 Pain in left elbow: Secondary | ICD-10-CM | POA: Diagnosis not present

## 2019-09-23 DIAGNOSIS — M25512 Pain in left shoulder: Secondary | ICD-10-CM | POA: Diagnosis not present

## 2019-10-02 DIAGNOSIS — M25522 Pain in left elbow: Secondary | ICD-10-CM | POA: Diagnosis not present

## 2019-10-02 DIAGNOSIS — M25512 Pain in left shoulder: Secondary | ICD-10-CM | POA: Diagnosis not present

## 2019-10-30 ENCOUNTER — Other Ambulatory Visit: Payer: Self-pay | Admitting: Internal Medicine

## 2019-10-30 DIAGNOSIS — F909 Attention-deficit hyperactivity disorder, unspecified type: Secondary | ICD-10-CM

## 2019-10-30 NOTE — Telephone Encounter (Signed)
Tried to refuse orders. I do not have the access to do so.

## 2019-10-31 MED ORDER — LISDEXAMFETAMINE DIMESYLATE 40 MG PO CAPS: 40.0000 mg | ORAL_CAPSULE | ORAL | 0 refills | Status: DC | PRN

## 2019-11-03 ENCOUNTER — Other Ambulatory Visit: Payer: Self-pay | Admitting: Internal Medicine

## 2019-11-03 DIAGNOSIS — F909 Attention-deficit hyperactivity disorder, unspecified type: Secondary | ICD-10-CM

## 2019-11-04 MED FILL — VYVANSE 40 MG CAPSULE: 40 | 90 days supply | Qty: 90 | Fill #0

## 2019-11-06 DIAGNOSIS — N529 Male erectile dysfunction, unspecified: Secondary | ICD-10-CM | POA: Diagnosis not present

## 2019-11-06 DIAGNOSIS — E291 Testicular hypofunction: Secondary | ICD-10-CM | POA: Diagnosis not present

## 2019-11-06 DIAGNOSIS — Z72820 Sleep deprivation: Secondary | ICD-10-CM | POA: Diagnosis not present

## 2019-11-08 ENCOUNTER — Other Ambulatory Visit: Payer: Self-pay | Admitting: Internal Medicine

## 2019-11-08 ENCOUNTER — Encounter: Payer: Self-pay | Admitting: Internal Medicine

## 2019-11-08 DIAGNOSIS — F909 Attention-deficit hyperactivity disorder, unspecified type: Secondary | ICD-10-CM

## 2019-11-08 NOTE — Telephone Encounter (Signed)
Rx was sent in on 11/03/2019. It will not let me refuse request.

## 2019-11-11 DIAGNOSIS — E291 Testicular hypofunction: Secondary | ICD-10-CM | POA: Diagnosis not present

## 2019-11-11 DIAGNOSIS — N529 Male erectile dysfunction, unspecified: Secondary | ICD-10-CM | POA: Diagnosis not present

## 2019-11-11 DIAGNOSIS — Z72820 Sleep deprivation: Secondary | ICD-10-CM | POA: Diagnosis not present

## 2019-11-12 DIAGNOSIS — E291 Testicular hypofunction: Secondary | ICD-10-CM | POA: Diagnosis not present

## 2019-11-12 DIAGNOSIS — N529 Male erectile dysfunction, unspecified: Secondary | ICD-10-CM | POA: Diagnosis not present

## 2019-11-12 DIAGNOSIS — Z72820 Sleep deprivation: Secondary | ICD-10-CM | POA: Diagnosis not present

## 2019-12-31 DIAGNOSIS — M25522 Pain in left elbow: Secondary | ICD-10-CM | POA: Diagnosis not present

## 2019-12-31 DIAGNOSIS — M25512 Pain in left shoulder: Secondary | ICD-10-CM | POA: Diagnosis not present

## 2020-01-14 DIAGNOSIS — M9901 Segmental and somatic dysfunction of cervical region: Secondary | ICD-10-CM | POA: Diagnosis not present

## 2020-01-14 DIAGNOSIS — M9902 Segmental and somatic dysfunction of thoracic region: Secondary | ICD-10-CM | POA: Diagnosis not present

## 2020-01-14 DIAGNOSIS — M7522 Bicipital tendinitis, left shoulder: Secondary | ICD-10-CM | POA: Diagnosis not present

## 2020-04-04 ENCOUNTER — Other Ambulatory Visit: Payer: Self-pay | Admitting: Internal Medicine

## 2020-04-04 DIAGNOSIS — F909 Attention-deficit hyperactivity disorder, unspecified type: Secondary | ICD-10-CM

## 2020-04-10 ENCOUNTER — Other Ambulatory Visit: Payer: Self-pay | Admitting: Internal Medicine

## 2020-04-10 DIAGNOSIS — F909 Attention-deficit hyperactivity disorder, unspecified type: Secondary | ICD-10-CM

## 2020-04-11 NOTE — Telephone Encounter (Signed)
New message:   Pt is calling and states he would like to know why his medication was denied. Please advise and the pt would like a call to let him know why and if it is being sent to pharmacy. Please advise.

## 2020-04-12 ENCOUNTER — Other Ambulatory Visit: Payer: Self-pay | Admitting: Internal Medicine

## 2020-04-12 ENCOUNTER — Telehealth: Payer: Self-pay

## 2020-04-12 DIAGNOSIS — F909 Attention-deficit hyperactivity disorder, unspecified type: Secondary | ICD-10-CM

## 2020-04-12 MED ORDER — LISDEXAMFETAMINE DIMESYLATE 40 MG PO CAPS: ORAL_CAPSULE | ORAL | 0 refills | Status: DC

## 2020-04-12 MED FILL — VYVANSE 40 MG CAPSULE: 40 | 90 days supply | Qty: 90 | Fill #0

## 2020-04-12 NOTE — Telephone Encounter (Signed)
Pt contacted and has scheduled CPE for Thursday at 08:00am.   Can refill for Vyvanse 40 mg capsule be sent to [facility]?

## 2020-04-14 ENCOUNTER — Encounter: Payer: Self-pay | Admitting: Internal Medicine

## 2020-04-14 ENCOUNTER — Ambulatory Visit (INDEPENDENT_AMBULATORY_CARE_PROVIDER_SITE_OTHER): Payer: 59 | Admitting: Internal Medicine

## 2020-04-14 ENCOUNTER — Other Ambulatory Visit: Payer: Self-pay

## 2020-04-14 VITALS — BP 140/80 | HR 74 | Temp 98.1°F | Ht 74.0 in | Wt 238.0 lb

## 2020-04-14 DIAGNOSIS — F909 Attention-deficit hyperactivity disorder, unspecified type: Secondary | ICD-10-CM

## 2020-04-14 DIAGNOSIS — D696 Thrombocytopenia, unspecified: Secondary | ICD-10-CM | POA: Diagnosis not present

## 2020-04-14 DIAGNOSIS — Z0000 Encounter for general adult medical examination without abnormal findings: Secondary | ICD-10-CM | POA: Diagnosis not present

## 2020-04-14 DIAGNOSIS — Z1159 Encounter for screening for other viral diseases: Secondary | ICD-10-CM | POA: Insufficient documentation

## 2020-04-14 DIAGNOSIS — K219 Gastro-esophageal reflux disease without esophagitis: Secondary | ICD-10-CM

## 2020-04-14 DIAGNOSIS — R030 Elevated blood-pressure reading, without diagnosis of hypertension: Secondary | ICD-10-CM

## 2020-04-14 NOTE — Patient Instructions (Signed)

## 2020-04-14 NOTE — Progress Notes (Signed)
Subjective:  Patient ID: Antonio James, male    DOB: 06/22/1986  Age: 34 y.o. MRN: [id number]  CC: Annual Exam  This visit occurred during the SARS-CoV-2 public health emergency.  Safety protocols were in place, including screening questions prior to the visit, additional usage of staff PPE, and extensive cleaning of exam room while observing appropriate contact time as indicated for disinfecting solutions.    HPI Antonio James presents for a CPX.  Pt states ADD status overall stable on current meds with overall good compliance and tolerability, and good effectiveness with respect to ability for concentration and task completion.  He has rare heartburn that is well controlled with the occasional dose of Tums.  He denies odynophagia, dysphagia, loss of appetite, or weight loss.  He is active and denies any recent episodes of headache, blurred vision, chest pain, shortness of breath, edema, or fatigue.  Outpatient Medications Prior to Visit  Medication Sig Dispense Refill   cholecalciferol (VITAMIN D) 1000 UNITS tablet Take 1,000 Units by mouth daily.     clomiPHENE (CLOMID) 50 MG tablet Take 25 mg by mouth every other day.   1   lisdexamfetamine (VYVANSE) 40 MG capsule TAKE 1 CAPSULE (40 MG TOTAL) BY MOUTH AS NEEDED (WORK) DAILY. 90 capsule 0   multivitamin (ONE-A-DAY MEN'S) TABS tablet Take 1 tablet by mouth daily.     Omega-3 Fatty Acids (FISH OIL PO) Take 15 mLs by mouth daily.     No facility-administered medications prior to visit.    ROS Review of Systems  Constitutional: Negative for appetite change, diaphoresis, fatigue and unexpected weight change.  HENT: Negative.   Eyes: Negative for visual disturbance.  Respiratory: Negative for cough, chest tightness, shortness of breath and wheezing.   Cardiovascular: Negative for chest pain, palpitations and leg swelling.  Gastrointestinal: Negative for abdominal pain, constipation, diarrhea, nausea and vomiting.    Endocrine: Negative.   Genitourinary: Negative.  Negative for difficulty urinating, dysuria, scrotal swelling and testicular pain.  Musculoskeletal: Negative for arthralgias and joint swelling.  Skin: Negative.  Negative for color change and pallor.  Neurological: Negative for dizziness, weakness, light-headedness and headaches.  Hematological: Negative for adenopathy. Does not bruise/bleed easily.  Psychiatric/Behavioral: Positive for decreased concentration. Negative for agitation, behavioral problems, confusion, dysphoric mood, hallucinations, self-injury, sleep disturbance and suicidal ideas. The patient is not nervous/anxious and is not hyperactive.     Objective:  BP 140/80 (BP Location: Left Arm, Patient Position: Sitting, Cuff Size: Large)    Pulse 74    Temp 98.1 F (36.7 C) (Oral)    Ht 6\' 2"  (1.88 m)    Wt 238 lb (108 kg)    SpO2 98%    BMI 30.56 kg/m   BP Readings from Last 3 Encounters:  04/14/20 140/80  10/02/18 120/70  07/20/18 124/74    Wt Readings from Last 3 Encounters:  04/14/20 238 lb (108 kg)  12/09/18 225 lb (102.1 kg)  10/02/18 242 lb 8 oz (110 kg)    Physical Exam Vitals reviewed.  Constitutional:      Appearance: Normal appearance.  HENT:     Nose: Nose normal.     Mouth/Throat:     Mouth: Mucous membranes are moist.  Eyes:     General: No scleral icterus.    Conjunctiva/sclera: Conjunctivae normal.  Cardiovascular:     Rate and Rhythm: Normal rate and regular rhythm.     Heart sounds: No murmur heard.   Pulmonary:  Effort: Pulmonary effort is normal.     Breath sounds: No stridor. No wheezing, rhonchi or rales.  Abdominal:     General: Abdomen is flat. Bowel sounds are normal. There is no distension.     Palpations: Abdomen is soft. There is no hepatomegaly, splenomegaly or mass.     Tenderness: There is no abdominal tenderness.  Musculoskeletal:        General: Normal range of motion.     Cervical back: Neck supple.     Right lower  leg: No edema.     Left lower leg: No edema.  Lymphadenopathy:     Cervical: No cervical adenopathy.  Skin:    General: Skin is warm and dry.     Coloration: Skin is not pale.  Neurological:     General: No focal deficit present.     Mental Status: He is alert and oriented to person, place, and time. Mental status is at baseline.  Psychiatric:        Mood and Affect: Mood normal.        Thought Content: Thought content normal.        Judgment: Judgment normal.     Lab Results  Component Value Date   WBC 4.3 04/14/2020   HGB 18.1 (H) 04/14/2020   HCT 54.1 (H) 04/14/2020   PLT 152 04/14/2020   GLUCOSE 89 04/14/2020   CHOL 155 04/14/2020   TRIG 80 04/14/2020   HDL 42 04/14/2020   LDLCALC 96 04/14/2020   ALT 32 04/14/2020   AST 25 04/14/2020   NA 137 04/14/2020   K 4.4 04/14/2020   CL 103 04/14/2020   CREATININE 1.15 04/14/2020   BUN 26 (H) 04/14/2020   CO2 28 04/14/2020   TSH 0.89 04/14/2020    DG Chest 2 View  Result Date: 07/20/2018 CLINICAL DATA:  Right chest pain EXAM: CHEST - 2 VIEW COMPARISON:  None. FINDINGS: Lungs are clear. No pleural effusion or pneumothorax. The heart is normal in size. Visualized osseous structures are within normal limits. IMPRESSION: Normal chest radiographs. Electronically Signed   By: Charline Bills M.D.   On: 07/20/2018 09:48   US Abdomen Limited RUQ  Result Date: 07/20/2018 CLINICAL DATA:  34 year old with right upper quadrant pain. EXAM: ULTRASOUND ABDOMEN LIMITED RIGHT UPPER QUADRANT COMPARISON:  None. FINDINGS: Gallbladder: No gallstones or wall thickening visualized. No sonographic Murphy sign noted by sonographer. Common bile duct: Diameter: 0.3 cm Liver: No focal lesion identified. Within normal limits in parenchymal echogenicity. Portal vein is patent on color Doppler imaging with normal direction of blood flow towards the liver. IMPRESSION: Normal right upper quadrant ultrasound. Electronically Signed   By: Richarda Overlie M.D.    On: 07/20/2018 11:18    Assessment & Plan:   Antonio James was seen today for annual exam.  Diagnoses and all orders for this visit:  Routine general medical examination at a health care facility- Exam completed, labs reviewed, vaccines reviewed and updated, patient education was given. -     Lipid panel; Future -     Hepatitis C antibody; Future -     Hepatitis C antibody -     Lipid panel  Gastroesophageal reflux disease without esophagitis- His symptoms are well controlled with the occasional dose of Tums.  No complications noted. -     CBC with Differential/Platelet; Future -     CBC with Differential/Platelet  Need for hepatitis C screening test -     Hepatitis C antibody; Future -  Hepatitis C antibody  Thrombocytopenia (HCC)- His platelet count is normal now.  Screening for B12 and folate deficiency is negative.  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  If this continues then I will recommend that he undergo phlebotomy. -     Vitamin B12; Future -     Folate; Future -     Folate -     Vitamin B12  Elevated blood-pressure reading without diagnosis of hypertension- Based on his symptoms, exam, and normal labs I think he has prehypertension.  Antihypertensive therapy is not indicated at this time.  I have asked him to return in 4 to 6 months to have his blood pressure rechecked. -     BASIC METABOLIC PANEL WITH GFR; Future -     TSH; Future -     Urinalysis, Routine w reflex microscopic; Future -     Hepatic function panel; Future -     VITAMIN D 25 Hydroxy (Vit-D Deficiency, Fractures); Future -     VITAMIN D 25 Hydroxy (Vit-D Deficiency, Fractures) -     Hepatic function panel -     Urinalysis, Routine w reflex microscopic -     TSH -     BASIC METABOLIC PANEL WITH GFR   I am having Antonio James maintain his cholecalciferol, multivitamin, clomiPHENE, Omega-3 Fatty Acids (FISH OIL PO), and lisdexamfetamine.  No orders of the defined types were  placed in this encounter.  In addition to time spent on CPE, I spent 50 minutes in preparing to see the patient by review of recent labs, imaging and procedures, obtaining and reviewing separately obtained history, communicating with the patient and family or caregiver, ordering medications, tests or procedures, and documenting clinical information in the EHR including the differential Dx, treatment, and any further evaluation and other management of  1. Gastroesophageal reflux disease without esophagitis 2. Thrombocytopenia (HCC) 3. Elevated blood-pressure reading without diagnosis of hypertension 4. Adult ADHD      Follow-up: Return in about 6 months (around 10/15/2020).  Sanda Linger, MD

## 2020-04-15 LAB — URINALYSIS, ROUTINE W REFLEX MICROSCOPIC
Bilirubin Urine: NEGATIVE
Glucose, UA: NEGATIVE
Hgb urine dipstick: NEGATIVE
Ketones, ur: NEGATIVE
Leukocytes,Ua: NEGATIVE
Nitrite: NEGATIVE
Protein, ur: NEGATIVE
Specific Gravity, Urine: 1.014 (ref 1.001–1.03)
pH: 7.5 (ref 5.0–8.0)

## 2020-04-15 LAB — BASIC METABOLIC PANEL WITH GFR
BUN/Creatinine Ratio: 23 (calc) — ABNORMAL HIGH (ref 6–22)
BUN: 26 mg/dL — ABNORMAL HIGH (ref 7–25)
CO2: 28 mmol/L (ref 20–32)
Calcium: 9 mg/dL (ref 8.6–10.3)
Chloride: 103 mmol/L (ref 98–110)
Creat: 1.15 mg/dL (ref 0.60–1.35)
GFR, Est African American: 96 mL/min/{1.73_m2} (ref >=60)
GFR, Est Non African American: 83 mL/min/{1.73_m2} (ref >=60)
Glucose, Bld: 89 mg/dL (ref 65–99)
Potassium: 4.4 mmol/L (ref 3.5–5.3)
Sodium: 137 mmol/L (ref 135–146)

## 2020-04-15 LAB — HEPATIC FUNCTION PANEL
AG Ratio: 1.9 (calc) (ref 1.0–2.5)
ALT: 32 U/L (ref 9–46)
AST: 25 U/L (ref 10–40)
Albumin: 4.3 g/dL (ref 3.6–5.1)
Alkaline phosphatase (APISO): 52 U/L (ref 36–130)
Bilirubin, Direct: 0.1 mg/dL (ref 0.0–0.2)
Globulin: 2.3 g/dL (calc) (ref 1.9–3.7)
Indirect Bilirubin: 0.4 mg/dL (calc) (ref 0.2–1.2)
Total Bilirubin: 0.5 mg/dL (ref 0.2–1.2)
Total Protein: 6.6 g/dL (ref 6.1–8.1)

## 2020-04-15 LAB — CBC WITH DIFFERENTIAL/PLATELET
Absolute Monocytes: 456 cells/uL (ref 200–950)
Basophils Absolute: 30 cells/uL (ref 0–200)
Basophils Relative: 0.7 %
Eosinophils Absolute: 108 cells/uL (ref 15–500)
Eosinophils Relative: 2.5 %
HCT: 54.1 % — ABNORMAL HIGH (ref 38.5–50.0)
Hemoglobin: 18.1 g/dL — ABNORMAL HIGH (ref 13.2–17.1)
Lymphs Abs: 1234 cells/uL (ref 850–3900)
MCH: 30.5 pg (ref 27.0–33.0)
MCHC: 33.5 g/dL (ref 32.0–36.0)
MCV: 91.1 fL (ref 80.0–100.0)
MPV: 10.9 fL (ref 7.5–12.5)
Monocytes Relative: 10.6 %
Neutro Abs: 2473 cells/uL (ref 1500–7800)
Neutrophils Relative %: 57.5 %
Platelets: 152 10*3/uL (ref 140–400)
RBC: 5.94 10*6/uL — ABNORMAL HIGH (ref 4.20–5.80)
RDW: 12.9 % (ref 11.0–15.0)
Total Lymphocyte: 28.7 %
WBC: 4.3 10*3/uL (ref 3.8–10.8)

## 2020-04-15 LAB — LIPID PANEL
Cholesterol: 155 mg/dL (ref <200)
HDL: 42 mg/dL (ref >=40)
LDL Cholesterol (Calc): 96 mg/dL (calc)
Non-HDL Cholesterol (Calc): 113 mg/dL (calc) (ref <130)
Total CHOL/HDL Ratio: 3.7 (calc) (ref <5.0)
Triglycerides: 80 mg/dL (ref <150)

## 2020-04-15 LAB — TSH: TSH: 0.89 mIU/L (ref 0.40–4.50)

## 2020-04-15 LAB — VITAMIN B12: Vitamin B-12: 800 pg/mL (ref 200–1100)

## 2020-04-15 LAB — VITAMIN D 25 HYDROXY (VIT D DEFICIENCY, FRACTURES): Vit D, 25-Hydroxy: 60 ng/mL (ref 30–100)

## 2020-04-15 LAB — HEPATITIS C ANTIBODY
Hepatitis C Ab: NONREACTIVE
SIGNAL TO CUT-OFF: 0.01 (ref <1.00)

## 2020-04-15 LAB — FOLATE: Folate: 15.2 ng/mL

## 2020-04-18 NOTE — Assessment & Plan Note (Signed)
He is doing well on the current dose of Vyvanse. Will continue.

## 2020-05-30 DIAGNOSIS — M25775 Osteophyte, left foot: Secondary | ICD-10-CM | POA: Diagnosis not present

## 2020-05-30 DIAGNOSIS — L600 Ingrowing nail: Secondary | ICD-10-CM | POA: Diagnosis not present

## 2020-05-30 MED FILL — AMOX-CLAV 500-125 MG TABLET: 500-125 | 7 days supply | Qty: 14 | Fill #0

## 2020-06-23 DIAGNOSIS — E291 Testicular hypofunction: Secondary | ICD-10-CM | POA: Diagnosis not present

## 2020-06-28 DIAGNOSIS — Z72820 Sleep deprivation: Secondary | ICD-10-CM | POA: Diagnosis not present

## 2020-06-28 DIAGNOSIS — Z6828 Body mass index (BMI) 28.0-28.9, adult: Secondary | ICD-10-CM | POA: Diagnosis not present

## 2020-06-28 DIAGNOSIS — R946 Abnormal results of thyroid function studies: Secondary | ICD-10-CM | POA: Diagnosis not present

## 2020-06-28 DIAGNOSIS — N529 Male erectile dysfunction, unspecified: Secondary | ICD-10-CM | POA: Diagnosis not present

## 2020-06-28 DIAGNOSIS — E291 Testicular hypofunction: Secondary | ICD-10-CM | POA: Diagnosis not present

## 2020-06-28 DIAGNOSIS — D751 Secondary polycythemia: Secondary | ICD-10-CM | POA: Diagnosis not present

## 2020-06-29 ENCOUNTER — Other Ambulatory Visit: Payer: Self-pay | Admitting: Internal Medicine

## 2020-06-29 ENCOUNTER — Encounter: Payer: Self-pay | Admitting: Internal Medicine

## 2020-06-29 DIAGNOSIS — R7989 Other specified abnormal findings of blood chemistry: Secondary | ICD-10-CM | POA: Insufficient documentation

## 2020-07-04 ENCOUNTER — Other Ambulatory Visit: Payer: Self-pay

## 2020-07-04 ENCOUNTER — Ambulatory Visit: Payer: 59 | Admitting: Internal Medicine

## 2020-07-04 ENCOUNTER — Encounter: Payer: Self-pay | Admitting: Internal Medicine

## 2020-07-04 VITALS — BP 134/82 | HR 74 | Temp 98.0°F | Ht 74.0 in | Wt 229.0 lb

## 2020-07-04 DIAGNOSIS — R7989 Other specified abnormal findings of blood chemistry: Secondary | ICD-10-CM

## 2020-07-04 DIAGNOSIS — E0590 Thyrotoxicosis, unspecified without thyrotoxic crisis or storm: Secondary | ICD-10-CM | POA: Diagnosis not present

## 2020-07-04 DIAGNOSIS — Z23 Encounter for immunization: Secondary | ICD-10-CM

## 2020-07-04 LAB — CBC WITH DIFFERENTIAL/PLATELET
Basophils Absolute: 0 10*3/uL (ref 0.0–0.1)
Basophils Relative: 0.4 % (ref 0.0–3.0)
Eosinophils Absolute: 0.1 10*3/uL (ref 0.0–0.7)
Eosinophils Relative: 1.5 % (ref 0.0–5.0)
HCT: 50.9 % (ref 39.0–52.0)
Hemoglobin: 17.6 g/dL — ABNORMAL HIGH (ref 13.0–17.0)
Lymphocytes Relative: 38.2 % (ref 12.0–46.0)
Lymphs Abs: 1.7 10*3/uL (ref 0.7–4.0)
MCHC: 34.5 g/dL (ref 30.0–36.0)
MCV: 86.8 fl (ref 78.0–100.0)
Monocytes Absolute: 0.5 10*3/uL (ref 0.1–1.0)
Monocytes Relative: 10 % (ref 3.0–12.0)
Neutro Abs: 2.3 10*3/uL (ref 1.4–7.7)
Neutrophils Relative %: 49.9 % (ref 43.0–77.0)
Platelets: 142 10*3/uL — ABNORMAL LOW (ref 150.0–400.0)
RBC: 5.86 Mil/uL — ABNORMAL HIGH (ref 4.22–5.81)
RDW: 13.3 % (ref 11.5–15.5)
WBC: 4.6 10*3/uL (ref 4.0–10.5)

## 2020-07-04 LAB — HEPATIC FUNCTION PANEL
ALT: 27 U/L (ref 0–53)
AST: 27 U/L (ref 0–37)
Albumin: 4.3 g/dL (ref 3.5–5.2)
Alkaline Phosphatase: 51 U/L (ref 39–117)
Bilirubin, Direct: 0.1 mg/dL (ref 0.0–0.3)
Total Bilirubin: 0.7 mg/dL (ref 0.2–1.2)
Total Protein: 6.8 g/dL (ref 6.0–8.3)

## 2020-07-04 NOTE — Progress Notes (Signed)
Subjective:  Patient ID: Antonio James, male    DOB: May 04, 1986  Age: 34 y.o. MRN: [id number]  CC: Follow-up  This visit occurred during the SARS-CoV-2 public health emergency.  Safety protocols were in place, including screening questions prior to the visit, additional usage of staff PPE, and extensive cleaning of exam room while observing appropriate contact time as indicated for disinfecting solutions.    HPI Antonio James presents for f/up - He recently applied for life insurance and was told that his TSH was low. He was approved for the insurance but not at the preferred rate.   Outpatient Medications Prior to Visit  Medication Sig Dispense Refill  . cholecalciferol (VITAMIN D) 1000 UNITS tablet Take 1,000 Units by mouth daily.    . clomiPHENE (CLOMID) 50 MG tablet Take 25 mg by mouth every other day.   1  . lisdexamfetamine (VYVANSE) 40 MG capsule TAKE 1 CAPSULE (40 MG TOTAL) BY MOUTH AS NEEDED (WORK) DAILY. 90 capsule 0  . multivitamin (ONE-A-DAY MEN'S) TABS tablet Take 1 tablet by mouth daily.    . Omega-3 Fatty Acids (FISH OIL PO) Take 15 mLs by mouth daily.     No facility-administered medications prior to visit.    ROS Review of Systems  Constitutional: Positive for unexpected weight change (wt loss). Negative for diaphoresis and fatigue.  HENT: Negative.   Eyes: Negative.   Respiratory: Negative for cough, shortness of breath and wheezing.   Cardiovascular: Negative for chest pain, palpitations and leg swelling.  Gastrointestinal: Negative for abdominal pain, diarrhea, nausea and vomiting.  Endocrine: Negative.   Genitourinary: Negative.  Negative for difficulty urinating.  Musculoskeletal: Negative for arthralgias and myalgias.  Skin: Negative.  Negative for color change, pallor and rash.  Neurological: Negative.  Negative for dizziness, weakness and light-headedness.  Hematological: Negative for adenopathy. Does not bruise/bleed easily.    Psychiatric/Behavioral: Positive for decreased concentration and sleep disturbance. Negative for behavioral problems and dysphoric mood. The patient is nervous/anxious.     Objective:  BP 134/82   Pulse 74   Temp 98 F (36.7 C) (Oral)   Ht 6\' 2"  (1.88 m)   Wt 229 lb (103.9 kg)   SpO2 98%   BMI 29.40 kg/m   BP Readings from Last 3 Encounters:  07/04/20 134/82  04/14/20 140/80  10/02/18 120/70    Wt Readings from Last 3 Encounters:  07/04/20 229 lb (103.9 kg)  04/14/20 238 lb (108 kg)  12/09/18 225 lb (102.1 kg)    Physical Exam Vitals reviewed.  Constitutional:      Appearance: Normal appearance.  HENT:     Nose: Nose normal.     Mouth/Throat:     Mouth: Mucous membranes are moist.  Eyes:     General: No scleral icterus.    Conjunctiva/sclera: Conjunctivae normal.  Cardiovascular:     Rate and Rhythm: Normal rate and regular rhythm.     Heart sounds: No murmur heard.   Pulmonary:     Effort: Pulmonary effort is normal.     Breath sounds: No stridor. No wheezing, rhonchi or rales.  Abdominal:     General: Abdomen is flat. Bowel sounds are normal. There is no distension.     Palpations: Abdomen is soft. There is no hepatomegaly, splenomegaly or mass.     Tenderness: There is no abdominal tenderness.  Musculoskeletal:        General: Normal range of motion.     Cervical back: Neck supple.  Right lower leg: No edema.     Left lower leg: No edema.  Lymphadenopathy:     Cervical: No cervical adenopathy.  Skin:    General: Skin is warm and dry.  Neurological:     General: No focal deficit present.     Mental Status: He is alert.  Psychiatric:        Attention and Perception: Attention and perception normal.        Mood and Affect: Mood is anxious. Mood is not depressed.        Speech: Speech is not delayed or tangential.        Behavior: Behavior is not agitated, slowed, aggressive or withdrawn.        Thought Content: Thought content normal.         Cognition and Memory: Cognition normal.     Lab Results  Component Value Date   WBC 4.6 07/04/2020   HGB 17.6 (H) 07/04/2020   HCT 50.9 07/04/2020   PLT 142.0 (L) 07/04/2020   GLUCOSE 89 04/14/2020   CHOL 155 04/14/2020   TRIG 80 04/14/2020   HDL 42 04/14/2020   LDLCALC 96 04/14/2020   ALT 27 07/04/2020   AST 27 07/04/2020   NA 137 04/14/2020   K 4.4 04/14/2020   CL 103 04/14/2020   CREATININE 1.15 04/14/2020   BUN 26 (H) 04/14/2020   CO2 28 04/14/2020   TSH 0.01 (L) 07/04/2020    DG Chest 2 View  Result Date: 07/20/2018 CLINICAL DATA:  Right chest pain EXAM: CHEST - 2 VIEW COMPARISON:  None. FINDINGS: Lungs are clear. No pleural effusion or pneumothorax. The heart is normal in size. Visualized osseous structures are within normal limits. IMPRESSION: Normal chest radiographs. Electronically Signed   By: Charline Bills M.D.   On: 07/20/2018 09:48   US Abdomen Limited RUQ  Result Date: 07/20/2018 CLINICAL DATA:  34 year old with right upper quadrant pain. EXAM: ULTRASOUND ABDOMEN LIMITED RIGHT UPPER QUADRANT COMPARISON:  None. FINDINGS: Gallbladder: No gallstones or wall thickening visualized. No sonographic Murphy sign noted by sonographer. Common bile duct: Diameter: 0.3 cm Liver: No focal lesion identified. Within normal limits in parenchymal echogenicity. Portal vein is patent on color Doppler imaging with normal direction of blood flow towards the liver. IMPRESSION: Normal right upper quadrant ultrasound. Electronically Signed   By: Richarda Overlie M.D.   On: 07/20/2018 11:18    Assessment & Plan:   Antonio James was seen today for follow-up.  Diagnoses and all orders for this visit:  Low TSH level- His TSH is low and T3/T4 are high. He appears hyperthyroid. I have asked him to see ENDO. -     Thyroid peroxidase antibody -     Thyroid Panel With TSH -     Hepatic function panel -     CBC with Differential/Platelet -     Ambulatory referral to  Endocrinology  Hyperthyroidism -     Ambulatory referral to Endocrinology  Other orders -     Flu Vaccine QUAD 6+ mos PF IM (Fluarix Quad PF)   I am having Antonio James maintain his cholecalciferol, multivitamin, clomiPHENE, Omega-3 Fatty Acids (FISH OIL PO), and lisdexamfetamine.  No orders of the defined types were placed in this encounter.    Follow-up: No follow-ups on file.  Sanda Linger, MD

## 2020-07-05 DIAGNOSIS — E0590 Thyrotoxicosis, unspecified without thyrotoxic crisis or storm: Secondary | ICD-10-CM | POA: Insufficient documentation

## 2020-07-05 LAB — THYROID PANEL WITH TSH
Free Thyroxine Index: 4.4 — ABNORMAL HIGH (ref 1.4–3.8)
T3 Uptake: 38 % — ABNORMAL HIGH (ref 22–35)
T4, Total: 11.5 ug/dL — ABNORMAL HIGH (ref 4.9–10.5)
TSH: 0.01 mIU/L — ABNORMAL LOW (ref 0.40–4.50)

## 2020-07-05 LAB — THYROID PEROXIDASE ANTIBODY: Thyroperoxidase Ab SerPl-aCnc: 1 IU/mL (ref <9)

## 2020-07-06 NOTE — Patient Instructions (Signed)

## 2020-07-21 DIAGNOSIS — M7918 Myalgia, other site: Secondary | ICD-10-CM | POA: Diagnosis not present

## 2020-07-21 DIAGNOSIS — M67922 Unspecified disorder of synovium and tendon, left upper arm: Secondary | ICD-10-CM | POA: Diagnosis not present

## 2020-07-21 DIAGNOSIS — M7542 Impingement syndrome of left shoulder: Secondary | ICD-10-CM | POA: Diagnosis not present

## 2020-07-21 DIAGNOSIS — M79622 Pain in left upper arm: Secondary | ICD-10-CM | POA: Diagnosis not present

## 2020-07-27 ENCOUNTER — Encounter: Payer: Self-pay | Admitting: Internal Medicine

## 2020-07-27 ENCOUNTER — Ambulatory Visit: Payer: 59 | Admitting: Internal Medicine

## 2020-07-27 ENCOUNTER — Other Ambulatory Visit: Payer: Self-pay

## 2020-07-27 ENCOUNTER — Other Ambulatory Visit: Payer: Self-pay | Admitting: Internal Medicine

## 2020-07-27 VITALS — BP 130/80 | HR 82 | Ht 74.0 in | Wt 229.1 lb

## 2020-07-27 DIAGNOSIS — E0590 Thyrotoxicosis, unspecified without thyrotoxic crisis or storm: Secondary | ICD-10-CM

## 2020-07-27 MED ORDER — METHIMAZOLE 5 MG PO TABS: 5.0000 mg | ORAL_TABLET | Freq: Every day | ORAL | 4 refills | Status: DC

## 2020-07-27 MED FILL — methIMAzole 5 MG TABS: 5 | 30 days supply | Qty: 30 | Fill #0

## 2020-07-27 NOTE — Patient Instructions (Signed)
We recommend that you follow these hyperthyroidism instructions at home:  1) Take Methimazole 5 mg 1 tablet  a day  If you develop severe sore throat with high fevers OR develop unexplained yellowing of your skin, eyes, under your tongue, severe abdominal pain with nausea or vomiting --> then please get evaluated immediately.  3) Get repeat thyroid labs 3 weeks    It is ESSENTIAL to get follow-up labs to help avoid over or undertreatment of your hyperthyroidism - both of which can be dangerous to your health.

## 2020-07-27 NOTE — Progress Notes (Signed)
Name: Antonio James  MRN/ DOB: [id number], 1986-02-16    Age/ Sex: 34 y.o., male    PCP: Etta Grandchild, MD   Reason for Endocrinology Evaluation: Hyperthyroidism     Date of Initial Endocrinology Evaluation: 07/27/2020     HPI: [name] is a 34 y.o. male with a past medical history of ADHD. The patient presented for initial endocrinology clinic visit on 07/27/2020 for consultative assistance with his Hyperthyroidism .   Pt was noted to have a suppressed  TSH during an application for life  Insurance, repeat TSH level in 07/2020 confirmed TSH at 0.01 uIU/ML with elevated total T4 at 11.5 mcg/dL.   Today he admits to weight loss ~ 15 lbs in the past 2 months  Denies diarrhea , tremors but has anxiety  Worsening insomnia   Denies local neck symptoms   Denies burning or itching in the eyes but has noted more redness in his eyes.  Has noted heat intolerance      No viral infections  Was on a vitamin that contained iodine but he has stopped this .  No biotin intake   No FH of thyroid disease    HISTORY:  Past Medical History:  Past Medical History:  Diagnosis Date  . Adult ADHD   . Depression    counseling throughout his life, no meds  . GERD (gastroesophageal reflux disease)   . Left hip pain    Intermittent, has known torn labrum in left hip  . Panic attacks   . Situational anxiety    Past Surgical History:  Past Surgical History:  Procedure Laterality Date  . WISDOM TOOTH EXTRACTION     No complications      Social History:  reports that he has quit smoking. His smoking use included e-cigarettes. He has quit using smokeless tobacco. He reports current alcohol use. He reports that he does not use drugs.  Family History: family history includes Cancer in his maternal grandmother and mother; Diabetes in his paternal grandmother; Heart attack in his paternal grandfather; Heart disease in his maternal grandfather; Other in his paternal grandfather;  Stroke in his maternal grandfather.   HOME MEDICATIONS: Allergies as of 07/27/2020      Reactions   Sulfa Antibiotics Hives, Rash      Medication List       Accurate as of July 27, 2020  3:00 PM. If you have any questions, ask your nurse or doctor.        cholecalciferol 1000 units tablet Commonly known as: VITAMIN D Take 1,000 Units by mouth daily.   clomiPHENE 50 MG tablet Commonly known as: CLOMID Take 25 mg by mouth every other day.   FISH OIL PO Take 15 mLs by mouth daily.   lisdexamfetamine 40 MG capsule Commonly known as: Vyvanse TAKE 1 CAPSULE (40 MG TOTAL) BY MOUTH AS NEEDED (WORK) DAILY.   methimazole 5 MG tablet Commonly known as: TAPAZOLE Take 1 tablet (5 mg total) by mouth daily. Started by: Scarlette Shorts, MD   multivitamin Tabs tablet Take 1 tablet by mouth daily.         REVIEW OF SYSTEMS: A comprehensive ROS was conducted with the patient and is negative except as per HPI    OBJECTIVE:  VS: BP 130/80   Pulse 82   Ht 6\' 2"  (1.88 m)   Wt 229 lb 2 oz (103.9 kg)   SpO2 98%   BMI 29.42 kg/m  Wt Readings from Last 3 Encounters:  07/27/20 229 lb 2 oz (103.9 kg)  07/04/20 229 lb (103.9 kg)  04/14/20 238 lb (108 kg)     EXAM: General: Pt appears well and is in NAD  Neck: General: Supple without adenopathy. Thyroid: Thyroid size normal. Right thyroid asymmetry ?nodules  Lungs: Clear with good BS bilat with no rales, rhonchi, or wheezes  Heart: Auscultation: RRR.  Abdomen: Normoactive bowel sounds, soft, nontender, without masses or organomegaly palpable  Extremities:  BL LE: No pretibial edema normal ROM and strength.  Skin: Hair: Texture and amount normal with gender appropriate distribution Skin Inspection: No rashes Skin Palpation: Skin temperature, texture, and thickness normal to palpation  Neuro: Cranial nerves: II - XII grossly intact  Motor: Normal strength throughout DTRs: 2+ and symmetric in UE without delay in  relaxation phase  Mental Status: Judgment, insight: Intact Orientation: Oriented to time, place, and person Mood and affect: No depression, anxiety, or agitation     DATA REVIEWED:  Results for JEROD, MCQUAIN (MRN [id number]) as of 08/01/2020 08:49  Ref. Range 07/04/2020 09:07  Alkaline Phosphatase Latest Ref Range: 39 - 117 U/L 51  Albumin Latest Ref Range: 3.5 - 5.2 g/dL 4.3  AST Latest Ref Range: 0 - 37 U/L 27  ALT Latest Ref Range: 0 - 53 U/L 27  Total Protein Latest Ref Range: 6.0 - 8.3 g/dL 6.8  Bilirubin, Direct Latest Ref Range: 0.0 - 0.3 mg/dL 0.1  Total Bilirubin Latest Ref Range: 0.2 - 1.2 mg/dL 0.7  WBC Latest Ref Range: 4.0 - 10.5 K/uL 4.6  RBC Latest Ref Range: 4.22 - 5.81 Mil/uL 5.86 (H)  Hemoglobin Latest Ref Range: 13.0 - 17.0 g/dL 63.7 (H)  HCT Latest Ref Range: 39 - 52 % 50.9  MCV Latest Ref Range: 78.0 - 100.0 fl 86.8  MCHC Latest Ref Range: 30.0 - 36.0 g/dL 85.8  RDW Latest Ref Range: 11.5 - 15.5 % 13.3  Platelets Latest Ref Range: 150 - 400 K/uL 142.0 (L)  Neutrophils Latest Ref Range: 43 - 77 % 49.9  Lymphocytes Latest Ref Range: 12 - 46 % 38.2  Monocytes Relative Latest Ref Range: 3 - 12 % 10.0  Eosinophil Latest Ref Range: 0 - 5 % 1.5  Basophil Latest Ref Range: 0 - 3 % 0.4  NEUT# Latest Ref Range: 1.4 - 7.7 K/uL 2.3  Lymphocyte # Latest Ref Range: 0.7 - 4.0 K/uL 1.7  Monocyte # Latest Ref Range: 0.1 - 1.0 K/uL 0.5  Eosinophils Absolute Latest Ref Range: 0.0 - 0.7 K/uL 0.1  Basophils Absolute Latest Ref Range: 0.0 - 0.1 K/uL 0.0     Results for TYEE, VANDEVOORDE (MRN [id number]) as of 07/27/2020 14:30  Ref. Range 07/04/2020 09:07  TSH Latest Ref Range: 0.40 - 4.50 mIU/L 0.01 (L)  Thyroxine (T4) Latest Ref Range: 4.9 - 10.5 mcg/dL 87.8 (H)  Free Thyroxine Index Latest Ref Range: 1.4 - 3.8  4.4 (H)  Thyroperoxidase Ab SerPl-aCnc Latest Ref Range: <9 IU/mL 1  T3 Uptake Latest Ref Range: 22 - 35 % 38 (H)      ASSESSMENT/PLAN/RECOMMENDATIONS:    1. Hyperthyroidism:  - Pt is clinically hyperthyroid  - No local neck symptoms  -We discussed that Graves' Disease is a result of an autoimmune condition involving the thyroid.    - We discussed with pt the benefits of methimazole in the Tx of hyperthyroidism, as well as the possible side effects/complications of anti-thyroid drug Tx (specifically detailing the rare,  but serious side effect of agranulocytosis). He was informed of need for regular thyroid function monitoring while on methimazole to ensure appropriate dosage without over-treatment. As well, we discussed the possible side effects of methimazole including the chance of rash, the small chance of liver irritation/juandice and the <=1 in 300-400 chance of sudden onset agranulocytosis.  We discussed importance of going to ED promptly (and stopping methimazole) if hewere to develop significant fever with severe sore throat of other evidence of acute infection.      We extensively discussed the various treatment options for hyperthyroidism and Graves disease including ablation therapy with radioactive iodine versus antithyroid drug treatment versus surgical therapy.  We recommended to the patient that we felt, at this time, that thionamide therapy would be most optimal.  We discussed the various possible benefits versus side effects of the various therapies.   I carefully explained to the patient that one of the consequences of I-131 ablation treatment would likely be permanent hypothyroidism which would require long-term replacement therapy with LT4.   Medications : Methimazole 5 mg, 1 tablet daily    F/U in 3 months  Labs in 3 weeks   Signed electronically by: Lyndle Herrlich, MD  [facility]  [facility] [address] [place] [place], [place] [address] Phone: [phone] FAX: [phone]   CC: Etta Grandchild, MD [address] [place] [address] Phone: [phone] Fax:  [phone]   Return to Endocrinology clinic as below: No future appointments.

## 2020-08-02 ENCOUNTER — Other Ambulatory Visit (INDEPENDENT_AMBULATORY_CARE_PROVIDER_SITE_OTHER): Payer: 59

## 2020-08-02 ENCOUNTER — Other Ambulatory Visit: Payer: Self-pay

## 2020-08-02 DIAGNOSIS — E0590 Thyrotoxicosis, unspecified without thyrotoxic crisis or storm: Secondary | ICD-10-CM

## 2020-08-02 LAB — TSH: TSH: <0.01 u[IU]/mL — ABNORMAL LOW (ref 0.35–4.50)

## 2020-08-02 LAB — T4, FREE: Free T4: 1.88 ng/dL — ABNORMAL HIGH (ref 0.60–1.60)

## 2020-08-03 ENCOUNTER — Other Ambulatory Visit: Payer: Self-pay | Admitting: Internal Medicine

## 2020-08-03 MED ORDER — METHIMAZOLE 5 MG PO TABS
10.0000 mg | ORAL_TABLET | Freq: Every day | ORAL | 4 refills | Status: DC
Start: 2020-08-03 — End: 2020-10-28

## 2020-08-04 LAB — TRAB (TSH RECEPTOR BINDING ANTIBODY): TRAB: 24.63 IU/L — ABNORMAL HIGH (ref <=2.00)

## 2020-08-05 ENCOUNTER — Encounter: Payer: Self-pay | Admitting: Internal Medicine

## 2020-08-09 ENCOUNTER — Ambulatory Visit: Payer: 59 | Admitting: Internal Medicine

## 2020-08-11 NOTE — Telephone Encounter (Signed)
Please ask him to get checked by an ophthalmologist. He needs to find one through his insurance network

## 2020-08-15 ENCOUNTER — Encounter: Payer: Self-pay | Admitting: Internal Medicine

## 2020-08-15 DIAGNOSIS — H5213 Myopia, bilateral: Secondary | ICD-10-CM | POA: Diagnosis not present

## 2020-08-15 DIAGNOSIS — H04123 Dry eye syndrome of bilateral lacrimal glands: Secondary | ICD-10-CM | POA: Diagnosis not present

## 2020-08-15 DIAGNOSIS — E0500 Thyrotoxicosis with diffuse goiter without thyrotoxic crisis or storm: Secondary | ICD-10-CM | POA: Diagnosis not present

## 2020-08-16 DIAGNOSIS — M9901 Segmental and somatic dysfunction of cervical region: Secondary | ICD-10-CM | POA: Diagnosis not present

## 2020-08-16 DIAGNOSIS — M9902 Segmental and somatic dysfunction of thoracic region: Secondary | ICD-10-CM | POA: Diagnosis not present

## 2020-08-16 DIAGNOSIS — M7522 Bicipital tendinitis, left shoulder: Secondary | ICD-10-CM | POA: Diagnosis not present

## 2020-08-18 MED FILL — methIMAzole 5 MG TABS: 5 | 30 days supply | Qty: 60 | Fill #0

## 2020-08-23 ENCOUNTER — Other Ambulatory Visit: Payer: Self-pay | Admitting: Internal Medicine

## 2020-08-23 DIAGNOSIS — F909 Attention-deficit hyperactivity disorder, unspecified type: Secondary | ICD-10-CM

## 2020-08-23 MED ORDER — LISDEXAMFETAMINE DIMESYLATE 40 MG PO CAPS: ORAL_CAPSULE | ORAL | 0 refills | Status: DC

## 2020-08-23 MED FILL — VYVANSE 40 MG CAPSULE: 40 | 90 days supply | Qty: 90 | Fill #0

## 2020-08-30 DIAGNOSIS — M7522 Bicipital tendinitis, left shoulder: Secondary | ICD-10-CM | POA: Diagnosis not present

## 2020-08-30 DIAGNOSIS — M9901 Segmental and somatic dysfunction of cervical region: Secondary | ICD-10-CM | POA: Diagnosis not present

## 2020-08-30 DIAGNOSIS — M9902 Segmental and somatic dysfunction of thoracic region: Secondary | ICD-10-CM | POA: Diagnosis not present

## 2020-09-29 DIAGNOSIS — M9901 Segmental and somatic dysfunction of cervical region: Secondary | ICD-10-CM | POA: Diagnosis not present

## 2020-09-29 DIAGNOSIS — M9902 Segmental and somatic dysfunction of thoracic region: Secondary | ICD-10-CM | POA: Diagnosis not present

## 2020-09-29 DIAGNOSIS — M7522 Bicipital tendinitis, left shoulder: Secondary | ICD-10-CM | POA: Diagnosis not present

## 2020-10-12 MED FILL — methIMAzole 5 MG TABS: 5 | 30 days supply | Qty: 60 | Fill #1

## 2020-10-27 DIAGNOSIS — E291 Testicular hypofunction: Secondary | ICD-10-CM | POA: Diagnosis not present

## 2020-10-27 DIAGNOSIS — R946 Abnormal results of thyroid function studies: Secondary | ICD-10-CM | POA: Diagnosis not present

## 2020-10-28 ENCOUNTER — Other Ambulatory Visit: Payer: Self-pay | Admitting: Internal Medicine

## 2020-10-28 ENCOUNTER — Ambulatory Visit: Payer: 59 | Admitting: Internal Medicine

## 2020-10-28 ENCOUNTER — Encounter: Payer: Self-pay | Admitting: Internal Medicine

## 2020-10-28 ENCOUNTER — Other Ambulatory Visit: Payer: Self-pay

## 2020-10-28 VITALS — BP 134/78 | HR 82 | Ht 74.0 in | Wt 233.2 lb

## 2020-10-28 DIAGNOSIS — E0500 Thyrotoxicosis with diffuse goiter without thyrotoxic crisis or storm: Secondary | ICD-10-CM | POA: Insufficient documentation

## 2020-10-28 DIAGNOSIS — E0590 Thyrotoxicosis, unspecified without thyrotoxic crisis or storm: Secondary | ICD-10-CM | POA: Diagnosis not present

## 2020-10-28 LAB — TSH: TSH: 0.2 u[IU]/mL — ABNORMAL LOW (ref 0.35–4.50)

## 2020-10-28 LAB — T4, FREE: Free T4: 1.39 ng/dL (ref 0.60–1.60)

## 2020-10-28 MED ORDER — METHIMAZOLE 5 MG PO TABS
5.0000 mg | ORAL_TABLET | Freq: Two times a day (BID) | ORAL | 1 refills | Status: DC
Start: 2020-10-28 — End: 2020-10-28

## 2020-10-28 NOTE — Progress Notes (Signed)
Name: Antonio James  MRN/ DOB: [id number], Nov 21, 1985    Age/ Sex: 35 y.o., male     PCP: Etta Grandchild, MD   Reason for Endocrinology Evaluation: Hyperthyroidism     Initial Endocrinology Clinic Visit: 07/27/2020    PATIENT IDENTIFIER: Antonio James is a 35 y.o., male with a past medical history of ADHD and hyperthyroidism. He has followed with [facility] since 07/27/2020 for consultative assistance with management of his Hyperthyroidism.   HISTORICAL SUMMARY: The patient was first diagnosed with hyperthyroidism in 07/2020 TSH at 0.01 uIU/ML with elevated total T4 at 11.5 mcg/dL.   TRAb elevated at 24.63 IU/L   Methimazole started 07/2020    No FH of thyroid disease  SUBJECTIVE:    Today (10/28/2020):  Antonio James is here for a follow up on hyperthyroidism secondary to Graves' Disease    Has an eye exam in 08/2020 with dry eye syndrome  He has been noted with weight gain  Denies fever  Denies constipation or abdominal pain  Denies local neck symptoms    Expecting baby #3 in September    HISTORY:  Past Medical History:  Past Medical History:  Diagnosis Date  . Adult ADHD   . Depression    counseling throughout his life, no meds  . GERD (gastroesophageal reflux disease)   . Left hip pain    Intermittent, has known torn labrum in left hip  . Panic attacks   . Situational anxiety     Past Surgical History:  Past Surgical History:  Procedure Laterality Date  . WISDOM TOOTH EXTRACTION     No complications     Social History:  reports that he has quit smoking. His smoking use included e-cigarettes. He has quit using smokeless tobacco. He reports current alcohol use. He reports that he does not use drugs. Family History:  Family History  Problem Relation Age of Onset  . Cancer Mother        breast  . Cancer Maternal Grandmother   . Heart disease Maternal Grandfather   . Stroke Maternal Grandfather   . Diabetes Paternal  Grandmother   . Heart attack Paternal Grandfather   . Other Paternal Grandfather        hypogonadism      HOME MEDICATIONS: Allergies as of 10/28/2020      Reactions   Sulfa Antibiotics Hives, Rash      Medication List       Accurate as of October 28, 2020  7:36 AM. If you have any questions, ask your nurse or doctor.        cholecalciferol 1000 units tablet Commonly known as: VITAMIN D Take 1,000 Units by mouth daily.   clomiPHENE 50 MG tablet Commonly known as: CLOMID Take 25 mg by mouth every other day.   FISH OIL PO Take 15 mLs by mouth daily.   lisdexamfetamine 40 MG capsule Commonly known as: Vyvanse TAKE 1 CAPSULE (40 MG TOTAL) BY MOUTH AS NEEDED (WORK) DAILY.   methimazole 5 MG tablet Commonly known as: TAPAZOLE Take 2 tablets (10 mg total) by mouth daily.   multivitamin Tabs tablet Take 1 tablet by mouth daily.         OBJECTIVE:   PHYSICAL EXAM: VS: BP 134/78   Pulse 82   Ht 6\' 2"  (1.88 m)   Wt 233 lb 4 oz (105.8 kg)   SpO2 98%   BMI 29.95 kg/m    EXAM: General: Pt appears well and  is in NAD  Eyes: External eye exam normal without stare, lid lag or exophthalmos.  EOM intact.    Neck: General: Supple without adenopathy. Thyroid: Thyroid size normal.  No goiter or nodules appreciated. No thyroid bruit.  Lungs: Clear with good BS bilat with no rales, rhonchi, or wheezes  Heart: Auscultation: RRR.  Abdomen: Normoactive bowel sounds, soft, nontender, without masses or organomegaly palpable  Extremities:  BL LE: No pretibial edema normal ROM and strength.  Mental Status: Judgment, insight: Intact Orientation: Oriented to time, place, and person Mood and affect: No depression, anxiety, or agitation     DATA REVIEWED:  Results for Antonio James, Antonio James (MRN [id number]) as of 10/28/2020 13:26  Ref. Range 08/02/2020 15:20 10/28/2020 07:52  TSH Latest Ref Range: 0.35 - 4.50 uIU/mL <0.01 (L) 0.20 (L)  T4,Free(Direct) Latest Ref Range: 0.60 - 1.60  ng/dL 9.16 (H) 3.84     ASSESSMENT / PLAN / RECOMMENDATIONS:   1. Hyperthyroidism secondary to Graves' Disease  - Pt is clinically euthyroid  - No local neck symptoms - Tolerating Methimazole  - FT4 is normal, no changes    Medications  Methimazole 5 mg BID      2. Graves' Disease:  - No extra thyroidal manifestations of Graves' disease     F/U in 4 months  Labs in 8 weeks     Signed electronically by: Lyndle Herrlich, MD  [facility]  [facility] [address] [place] [place], [place] [address] Phone: [phone] FAX: [phone]      CC: Etta Grandchild, MD [address] [place] [address] Phone: [phone]  Fax: [phone]   Return to Endocrinology clinic as below: No future appointments.

## 2020-11-03 DIAGNOSIS — E291 Testicular hypofunction: Secondary | ICD-10-CM | POA: Diagnosis not present

## 2020-11-03 DIAGNOSIS — Z6829 Body mass index (BMI) 29.0-29.9, adult: Secondary | ICD-10-CM | POA: Diagnosis not present

## 2020-11-03 DIAGNOSIS — D751 Secondary polycythemia: Secondary | ICD-10-CM | POA: Diagnosis not present

## 2020-11-03 DIAGNOSIS — N529 Male erectile dysfunction, unspecified: Secondary | ICD-10-CM | POA: Diagnosis not present

## 2020-12-21 ENCOUNTER — Other Ambulatory Visit (INDEPENDENT_AMBULATORY_CARE_PROVIDER_SITE_OTHER): Payer: 59

## 2020-12-21 ENCOUNTER — Other Ambulatory Visit: Payer: Self-pay

## 2020-12-21 DIAGNOSIS — E0590 Thyrotoxicosis, unspecified without thyrotoxic crisis or storm: Secondary | ICD-10-CM

## 2020-12-21 LAB — TSH: TSH: 3.21 u[IU]/mL (ref 0.35–4.50)

## 2020-12-21 LAB — T4, FREE: Free T4: 0.93 ng/dL (ref 0.60–1.60)

## 2020-12-29 ENCOUNTER — Other Ambulatory Visit: Payer: Self-pay | Admitting: Internal Medicine

## 2020-12-29 ENCOUNTER — Other Ambulatory Visit (HOSPITAL_COMMUNITY): Payer: Self-pay

## 2020-12-29 DIAGNOSIS — F909 Attention-deficit hyperactivity disorder, unspecified type: Secondary | ICD-10-CM

## 2020-12-29 MED ORDER — LISDEXAMFETAMINE DIMESYLATE 40 MG PO CAPS
ORAL_CAPSULE | ORAL | 0 refills | Status: DC
  Filled 2020-12-29: qty 90, 90d supply, fill #0

## 2021-01-02 ENCOUNTER — Other Ambulatory Visit (HOSPITAL_COMMUNITY): Payer: Self-pay

## 2021-01-15 ENCOUNTER — Encounter: Payer: Self-pay | Admitting: Internal Medicine

## 2021-01-17 ENCOUNTER — Ambulatory Visit: Payer: 59 | Admitting: Internal Medicine

## 2021-01-22 NOTE — Progress Notes (Signed)
Subjective:    Patient ID: Antonio James, male    DOB: 1985/11/25, 35 y.o.   MRN: [id number]  HPI The patient is here for an acute visit for arm swelling/vein concerns.   Right forearm feels heavy and full.  At times there is a pulling sensation.  He feels a rush of fluid in the arm when he lowers it.  The veins in his right forearm are more prominent than the left.  He noticed this 6 months ago.  He denies injury.      He works out regularly.  He feels the symptoms ( heaviness/fullness) are worse with using it.     He denies pain, n/t, change in color/temperature.    Medications and allergies reviewed with patient and updated if appropriate.  Patient Active Problem List   Diagnosis Date Noted  . Graves disease 10/28/2020  . Hyperthyroidism 07/05/2020  . Low TSH level 06/29/2020  . Elevated blood-pressure reading without diagnosis of hypertension 04/14/2020  . Routine general medical examination at a health care facility 02/16/2015  . Hypogonadism male 02/16/2015  . Adult ADHD 07/15/2014  . GERD (gastroesophageal reflux disease) 01/21/2014    Current Outpatient Medications on File Prior to Visit  Medication Sig Dispense Refill  . cholecalciferol (VITAMIN D) 1000 UNITS tablet Take 1,000 Units by mouth daily.    . clomiPHENE (CLOMID) 50 MG tablet Take 25 mg by mouth every other day.   1  . lisdexamfetamine (VYVANSE) 40 MG capsule TAKE 1 CAPSULE (40 MG TOTAL) BY MOUTH AS NEEDED (WORK) DAILY. 90 capsule 0  . methimazole (TAPAZOLE) 5 MG tablet TAKE 1 TABLET BY MOUTH TWICE DAILY. (Patient taking differently: Take by mouth daily.) 180 tablet 1  . multivitamin (ONE-A-DAY MEN'S) TABS tablet Take 1 tablet by mouth daily.    . Omega-3 Fatty Acids (FISH OIL PO) Take 15 mLs by mouth daily.    . finasteride (PROPECIA) 1 MG tablet Take 1 mg by mouth daily.     No current facility-administered medications on file prior to visit.    Past Medical History:  Diagnosis Date  . Adult  ADHD   . Depression    counseling throughout his life, no meds  . GERD (gastroesophageal reflux disease)   . Left hip pain    Intermittent, has known torn labrum in left hip  . Panic attacks   . Situational anxiety     Past Surgical History:  Procedure Laterality Date  . WISDOM TOOTH EXTRACTION     No complications    Social History   Socioeconomic History  . Marital status: Married    Spouse name: Not on file  . Number of children: Not on file  . Years of education: Not on file  . Highest education level: Not on file  Occupational History  . Not on file  Tobacco Use  . Smoking status: Former Smoker    Types: E-cigarettes  . Smokeless tobacco: Former Clinical biochemist  . Vaping Use: Never used  Substance and Sexual Activity  . Alcohol use: Yes  . Drug use: No  . Sexual activity: Not on file  Other Topics Concern  . Not on file  Social History Narrative   Married, no children.   Lawyer: insurance and personal injury.   Orig from [place], lives [place].  Went to [employer], then [employer].   No Tob.  ETOH-social.     No drugs.   Exercise: lifts wts 3 x/week, runs 1-2 times  per week.   Social Determinants of Health   Financial Resource Strain: Not on file  Food Insecurity: Not on file  Transportation Needs: Not on file  Physical Activity: Not on file  Stress: Not on file  Social Connections: Not on file    Family History  Problem Relation Age of Onset  . Cancer Mother        breast  . Cancer Maternal Grandmother   . Heart disease Maternal Grandfather   . Stroke Maternal Grandfather   . Diabetes Paternal Grandmother   . Heart attack Paternal Grandfather   . Other Paternal Grandfather        hypogonadism    Review of Systems  Constitutional: Negative for chills and fever.  Respiratory: Negative for cough and shortness of breath.   Cardiovascular: Negative for chest pain and palpitations.  Musculoskeletal:       No change in temp in right arm  Skin: Negative  for color change.  Neurological: Negative for weakness and numbness.       Objective:   Vitals:   01/23/21 0833  BP: 122/78  Pulse: 65  Temp: 98.1 F (36.7 C)  SpO2: 98%   BP Readings from Last 3 Encounters:  01/23/21 122/78  10/28/20 134/78  07/27/20 130/80   Wt Readings from Last 3 Encounters:  01/23/21 229 lb (103.9 kg)  10/28/20 233 lb 4 oz (105.8 kg)  07/27/20 229 lb 2 oz (103.9 kg)   Body mass index is 29.4 kg/m.   Physical Exam Constitutional:      General: He is not in acute distress.    Appearance: Normal appearance. He is not ill-appearing.  HENT:     Head: Normocephalic and atraumatic.  Cardiovascular:     Comments: Normal pulses in bl UE - more prominent and increased number of veins in R forearm compared to left forearm.  No prominent veins in upper arm, neck or chest Musculoskeletal:        General: No swelling, tenderness or deformity.  Skin:    General: Skin is warm and dry.     Coloration: Skin is not pale.     Findings: No bruising, erythema or lesion.  Neurological:     Mental Status: He is alert.     Sensory: No sensory deficit (b/l UE).     Motor: No weakness (b/l UE).            Assessment & Plan:    Right arm heaviness: Acute Right forearm heaviness, fullness Started 6 months ago - no injury Increased veins - number and prominence compared to left arm No swelling, but ? Venous obstruction or abn Will order vas [place] of RUE - depending on results will determine if further evaluation is needed     This visit occurred during the SARS-CoV-2 public health emergency.  Safety protocols were in place, including screening questions prior to the visit, additional usage of staff PPE, and extensive cleaning of exam room while observing appropriate contact time as indicated for disinfecting solutions.

## 2021-01-23 ENCOUNTER — Other Ambulatory Visit: Payer: Self-pay

## 2021-01-23 ENCOUNTER — Ambulatory Visit (INDEPENDENT_AMBULATORY_CARE_PROVIDER_SITE_OTHER): Payer: 59 | Admitting: Internal Medicine

## 2021-01-23 ENCOUNTER — Encounter: Payer: Self-pay | Admitting: Internal Medicine

## 2021-01-23 VITALS — BP 122/78 | HR 65 | Temp 98.1°F | Ht 74.0 in | Wt 229.0 lb

## 2021-01-23 DIAGNOSIS — R29898 Other symptoms and signs involving the musculoskeletal system: Secondary | ICD-10-CM | POA: Diagnosis not present

## 2021-01-23 NOTE — Patient Instructions (Addendum)
    An ultrasound of your arm was ordered.   They will call you to schedule this.  We will let you know the results when it is done.

## 2021-02-01 ENCOUNTER — Ambulatory Visit (HOSPITAL_COMMUNITY)
Admission: RE | Admit: 2021-02-01 | Payer: 59 | Source: Ambulatory Visit | Attending: Internal Medicine | Admitting: Internal Medicine

## 2021-02-16 ENCOUNTER — Ambulatory Visit (HOSPITAL_COMMUNITY)
Admission: RE | Admit: 2021-02-16 | Discharge: 2021-02-16 | Disposition: A | Discharge status: Home / Self Care | Payer: 59 | Source: Ambulatory Visit | Attending: Internal Medicine | Admitting: Internal Medicine

## 2021-02-16 ENCOUNTER — Other Ambulatory Visit: Payer: Self-pay

## 2021-02-16 DIAGNOSIS — R29898 Other symptoms and signs involving the musculoskeletal system: Secondary | ICD-10-CM | POA: Diagnosis not present

## 2021-02-20 ENCOUNTER — Other Ambulatory Visit (HOSPITAL_COMMUNITY): Payer: Self-pay

## 2021-02-20 MED FILL — Methimazole Tab 5 MG: ORAL | 3 days supply | Qty: 6 | Fill #0 | Status: AC

## 2021-02-21 ENCOUNTER — Other Ambulatory Visit (HOSPITAL_COMMUNITY): Payer: Self-pay

## 2021-02-21 MED FILL — Methimazole Tab 5 MG: ORAL | 87 days supply | Qty: 174 | Fill #0 | Status: CN

## 2021-02-22 ENCOUNTER — Other Ambulatory Visit (HOSPITAL_COMMUNITY): Payer: Self-pay

## 2021-02-24 ENCOUNTER — Other Ambulatory Visit (HOSPITAL_COMMUNITY): Payer: Self-pay

## 2021-02-24 MED ORDER — METHYLPREDNISOLONE 4 MG PO TBPK
ORAL_TABLET | ORAL | 0 refills | Status: DC
  Filled 2021-02-24: qty 21, 6d supply, fill #0

## 2021-03-02 ENCOUNTER — Other Ambulatory Visit (HOSPITAL_COMMUNITY): Payer: Self-pay

## 2021-03-02 ENCOUNTER — Encounter: Payer: Self-pay | Admitting: Internal Medicine

## 2021-03-02 ENCOUNTER — Ambulatory Visit: Payer: 59 | Admitting: Internal Medicine

## 2021-03-02 ENCOUNTER — Other Ambulatory Visit: Payer: Self-pay

## 2021-03-02 VITALS — BP 118/78 | HR 86 | Wt 230.6 lb

## 2021-03-02 DIAGNOSIS — E0500 Thyrotoxicosis with diffuse goiter without thyrotoxic crisis or storm: Secondary | ICD-10-CM

## 2021-03-02 DIAGNOSIS — E0590 Thyrotoxicosis, unspecified without thyrotoxic crisis or storm: Secondary | ICD-10-CM | POA: Diagnosis not present

## 2021-03-02 LAB — COMPREHENSIVE METABOLIC PANEL
ALT: 26 U/L (ref 0–53)
AST: 28 U/L (ref 0–37)
Albumin: 4.5 g/dL (ref 3.5–5.2)
Alkaline Phosphatase: 57 U/L (ref 39–117)
BUN: 25 mg/dL — ABNORMAL HIGH (ref 6–23)
CO2: 29 mEq/L (ref 19–32)
Calcium: 9.4 mg/dL (ref 8.4–10.5)
Chloride: 103 mEq/L (ref 96–112)
Creatinine, Ser: 1.14 mg/dL (ref 0.40–1.50)
GFR: 83.21 mL/min (ref >60.00)
Glucose, Bld: 67 mg/dL — ABNORMAL LOW (ref 70–99)
Potassium: 4.1 mEq/L (ref 3.5–5.1)
Sodium: 139 mEq/L (ref 135–145)
Total Bilirubin: 0.7 mg/dL (ref 0.2–1.2)
Total Protein: 7.1 g/dL (ref 6.0–8.3)

## 2021-03-02 LAB — CBC WITH DIFFERENTIAL/PLATELET
Basophils Absolute: 0 10*3/uL (ref 0.0–0.1)
Basophils Relative: 0.6 % (ref 0.0–3.0)
Eosinophils Absolute: 0.1 10*3/uL (ref 0.0–0.7)
Eosinophils Relative: 1.7 % (ref 0.0–5.0)
HCT: 49.3 % (ref 39.0–52.0)
Hemoglobin: 17 g/dL (ref 13.0–17.0)
Lymphocytes Relative: 37.1 % (ref 12.0–46.0)
Lymphs Abs: 1.5 10*3/uL (ref 0.7–4.0)
MCHC: 34.6 g/dL (ref 30.0–36.0)
MCV: 87.5 fl (ref 78.0–100.0)
Monocytes Absolute: 0.3 10*3/uL (ref 0.1–1.0)
Monocytes Relative: 8.5 % (ref 3.0–12.0)
Neutro Abs: 2.1 10*3/uL (ref 1.4–7.7)
Neutrophils Relative %: 52.1 % (ref 43.0–77.0)
Platelets: 150 10*3/uL (ref 150.0–400.0)
RBC: 5.64 Mil/uL (ref 4.22–5.81)
RDW: 13.6 % (ref 11.5–15.5)
WBC: 4.1 10*3/uL (ref 4.0–10.5)

## 2021-03-02 LAB — TSH: TSH: 3.42 u[IU]/mL (ref 0.35–5.50)

## 2021-03-02 LAB — T4, FREE: Free T4: 1.08 ng/dL (ref 0.60–1.60)

## 2021-03-02 NOTE — Progress Notes (Signed)
Name: Antonio James  MRN/ DOB: [id number], 04-Jan-1986    Age/ Sex: 35 y.o., male     PCP: Etta Grandchild, MD   Reason for Endocrinology Evaluation: Hyperthyroidism     Initial Endocrinology Clinic Visit: 07/27/2020    PATIENT IDENTIFIER: Antonio James is a 35 y.o., male with a past medical history of ADHD and hyperthyroidism. He has followed with [facility] since 07/27/2020 for consultative assistance with management of his Hyperthyroidism.   HISTORICAL SUMMARY: The patient was first diagnosed with hyperthyroidism in 07/2020 TSH at 0.01 uIU/ML with elevated total T4 at 11.5 mcg/dL.   TRAb elevated at 24.63 IU/L   Methimazole started 07/2020    No FH of thyroid disease  SUBJECTIVE:    Today (03/02/2021):  Antonio James is here for a follow up on hyperthyroidism secondary to Graves' Disease    Has an eye exam in 08/2020 with dry eye syndrome - eyes have been better  He has been noted with weight gain  Denies fever  Denies constipation or abdominal pain  Denies local neck symptoms    Expecting baby #3 in 06/2021   Methimazole 5 mg, 1.5 tabs daily    HISTORY:  Past Medical History:  Past Medical History:  Diagnosis Date   Adult ADHD    Depression    counseling throughout his life, no meds   GERD (gastroesophageal reflux disease)    Left hip pain    Intermittent, has known torn labrum in left hip   Panic attacks    Situational anxiety    Past Surgical History:  Past Surgical History:  Procedure Laterality Date   WISDOM TOOTH EXTRACTION     No complications   Social History:  reports that he has quit smoking. His smoking use included e-cigarettes. He has quit using smokeless tobacco. He reports current alcohol use. He reports that he does not use drugs. Family History:  Family History  Problem Relation Age of Onset   Cancer Mother        breast   Cancer Maternal Grandmother    Heart disease Maternal Grandfather    Stroke Maternal  Grandfather    Diabetes Paternal Grandmother    Heart attack Paternal Grandfather    Other Paternal Grandfather        hypogonadism     HOME MEDICATIONS: Allergies as of 03/02/2021       Reactions   Sulfa Antibiotics Hives, Rash        Medication List        Accurate as of March 02, 2021 12:18 PM. If you have any questions, ask your nurse or doctor.          STOP taking these medications    methylPREDNISolone 4 MG Tbpk tablet Commonly known as: Medrol Stopped by: Scarlette Shorts, MD       TAKE these medications    cholecalciferol 1000 units tablet Commonly known as: VITAMIN D Take 1,000 Units by mouth daily.   clomiPHENE 50 MG tablet Commonly known as: CLOMID Take 25 mg by mouth every other day.   finasteride 1 MG tablet Commonly known as: PROPECIA Take 1 mg by mouth daily.   FISH OIL PO Take 15 mLs by mouth daily.   methimazole 5 MG tablet Commonly known as: TAPAZOLE TAKE 1 TABLET BY MOUTH TWICE DAILY. What changed: when to take this   multivitamin Tabs tablet Take 1 tablet by mouth daily.   Vyvanse 40 MG capsule Generic drug:  lisdexamfetamine TAKE 1 CAPSULE (40 MG TOTAL) BY MOUTH AS NEEDED (WORK) DAILY.          OBJECTIVE:   PHYSICAL EXAM: VS: BP 118/78   Pulse 86   Wt 230 lb 9.6 oz (104.6 kg)   SpO2 99%   BMI 29.61 kg/m    EXAM: General: Pt appears well and is in NAD  Eyes: External eye exam normal without stare, lid lag or exophthalmos.  EOM intact.    Neck: General: Supple without adenopathy. Thyroid: Thyroid size normal.  No goiter or nodules appreciated. No thyroid bruit.  Lungs: Clear with good BS bilat with no rales, rhonchi, or wheezes  Heart: Auscultation: RRR.  Abdomen: Normoactive bowel sounds, soft, nontender, without masses or organomegaly palpable  Extremities:  BL LE: No pretibial edema normal ROM and strength.  Mental Status: Judgment, insight: Intact Orientation: Oriented to time, place, and person Mood  and affect: No depression, anxiety, or agitation     DATA REVIEWED: Results for Antonio James, Antonio James (MRN [id number]) as of 03/03/2021 08:08  Ref. Range 03/02/2021 07:48  Sodium Latest Ref Range: 135 - 145 mEq/L 139  Potassium Latest Ref Range: 3.5 - 5.1 mEq/L 4.1  Chloride Latest Ref Range: 96 - 112 mEq/L 103  CO2 Latest Ref Range: 19 - 32 mEq/L 29  Glucose Latest Ref Range: 70 - 99 mg/dL 67 (L)  BUN Latest Ref Range: 6 - 23 mg/dL 25 (H)  Creatinine Latest Ref Range: 0.40 - 1.50 mg/dL 7.03  Calcium Latest Ref Range: 8.4 - 10.5 mg/dL 9.4  Alkaline Phosphatase Latest Ref Range: 39 - 117 U/L 57  Albumin Latest Ref Range: 3.5 - 5.2 g/dL 4.5  AST Latest Ref Range: 0 - 37 U/L 28  ALT Latest Ref Range: 0 - 53 U/L 26  Total Protein Latest Ref Range: 6.0 - 8.3 g/dL 7.1  Total Bilirubin Latest Ref Range: 0.2 - 1.2 mg/dL 0.7  GFR Latest Ref Range: >60.00 mL/min 83.21  WBC Latest Ref Range: 4.0 - 10.5 K/uL 4.1  RBC Latest Ref Range: 4.22 - 5.81 Mil/uL 5.64  Hemoglobin Latest Ref Range: 13.0 - 17.0 g/dL 50.0  HCT Latest Ref Range: 39.0 - 52.0 % 49.3  MCV Latest Ref Range: 78.0 - 100.0 fl 87.5  MCHC Latest Ref Range: 30.0 - 36.0 g/dL 93.8  RDW Latest Ref Range: 11.5 - 15.5 % 13.6  Platelets Latest Ref Range: 150.0 - 400.0 K/uL 150.0  Neutrophils Latest Ref Range: 43.0 - 77.0 % 52.1  Lymphocytes Latest Ref Range: 12.0 - 46.0 % 37.1  Monocytes Relative Latest Ref Range: 3.0 - 12.0 % 8.5  Eosinophil Latest Ref Range: 0.0 - 5.0 % 1.7  Basophil Latest Ref Range: 0.0 - 3.0 % 0.6  NEUT# Latest Ref Range: 1.4 - 7.7 K/uL 2.1  Lymphocyte # Latest Ref Range: 0.7 - 4.0 K/uL 1.5  Monocyte # Latest Ref Range: 0.1 - 1.0 K/uL 0.3  Eosinophils Absolute Latest Ref Range: 0.0 - 0.7 K/uL 0.1  Basophils Absolute Latest Ref Range: 0.0 - 0.1 K/uL 0.0  TSH Latest Ref Range: 0.35 - 5.50 uIU/mL 3.42  T4,Free(Direct) Latest Ref Range: 0.60 - 1.60 ng/dL 1.82     ASSESSMENT / PLAN / RECOMMENDATIONS:   Hyperthyroidism  secondary to Graves' Disease  - Pt is clinically euthyroid  - No local neck symptoms - Tolerating Methimazole  - TFT's are normal, no changes    Medications  Methimazole 5 mg, 1.5 tabs daily      2. Graves'  Disease:  - No extra thyroidal manifestations of Graves' disease     F/U in 4 months  Labs in 8 weeks     Signed electronically by: Lyndle Herrlich, MD  [facility] Endocrinology  [facility] [address] [place] [place] [place], [place] [address] Phone: [phone] FAX: [phone]      CC: Etta Grandchild, MD [address] [place] [address] Phone: [phone]  Fax: [phone]   Return to Endocrinology clinic as below: Future Appointments  Date Time Provider Department Center  04/17/2021  8:20 AM Etta Grandchild, MD [facility] None  04/27/2021  8:15 AM [facility] None  07/06/2021  7:30 AM Jenayah Antu, Konrad Dolores, MD [facility] None

## 2021-03-02 NOTE — Patient Instructions (Signed)
-   Stop by the lab please

## 2021-03-08 ENCOUNTER — Other Ambulatory Visit (HOSPITAL_COMMUNITY): Payer: Self-pay

## 2021-03-08 MED FILL — Methimazole Tab 5 MG: ORAL | 90 days supply | Qty: 180 | Fill #1 | Status: AC

## 2021-04-03 ENCOUNTER — Encounter: Payer: 59 | Admitting: Internal Medicine

## 2021-04-17 ENCOUNTER — Other Ambulatory Visit: Payer: Self-pay

## 2021-04-17 ENCOUNTER — Ambulatory Visit (INDEPENDENT_AMBULATORY_CARE_PROVIDER_SITE_OTHER): Payer: 59 | Admitting: Internal Medicine

## 2021-04-17 ENCOUNTER — Encounter: Payer: Self-pay | Admitting: Internal Medicine

## 2021-04-17 VITALS — BP 124/82 | HR 70 | Temp 97.8°F | Resp 16 | Ht 74.0 in | Wt 225.0 lb

## 2021-04-17 DIAGNOSIS — Z0000 Encounter for general adult medical examination without abnormal findings: Secondary | ICD-10-CM

## 2021-04-17 NOTE — Progress Notes (Signed)
Subjective:  Patient ID: Antonio James, male    DOB: 07-29-1986  Age: 35 y.o. MRN: [id number]  CC: Annual Exam  This visit occurred during the SARS-CoV-2 public health emergency.  Safety protocols were in place, including screening questions prior to the visit, additional usage of staff PPE, and extensive cleaning of exam room while observing appropriate contact time as indicated for disinfecting solutions.    HPI Antonio James presents for a CPX.  He has felt well recently. Offers no complaints.  Pt states ADD status overall stable on current meds with overall good compliance and tolerability, and good effectiveness with respect to ability for concentration and task completion.   Outpatient Medications Prior to Visit  Medication Sig Dispense Refill   cholecalciferol (VITAMIN D) 1000 UNITS tablet Take 1,000 Units by mouth daily.     clomiPHENE (CLOMID) 50 MG tablet Take 25 mg by mouth every other day.   1   finasteride (PROPECIA) 1 MG tablet Take 1 mg by mouth daily.     lisdexamfetamine (VYVANSE) 40 MG capsule TAKE 1 CAPSULE (40 MG TOTAL) BY MOUTH AS NEEDED (WORK) DAILY. 90 capsule 0   methimazole (TAPAZOLE) 5 MG tablet TAKE 1 TABLET BY MOUTH TWICE DAILY. (Patient taking differently: Take by mouth daily.) 180 tablet 1   multivitamin (ONE-A-DAY MEN'S) TABS tablet Take 1 tablet by mouth daily.     Omega-3 Fatty Acids (FISH OIL PO) Take 15 mLs by mouth daily.     No facility-administered medications prior to visit.    ROS Review of Systems  Constitutional:  Negative for appetite change, diaphoresis, fatigue and unexpected weight change.  HENT: Negative.    Eyes: Negative.   Respiratory:  Negative for cough, chest tightness, shortness of breath and wheezing.   Cardiovascular:  Negative for chest pain, palpitations and leg swelling.  Gastrointestinal:  Negative for abdominal pain, diarrhea and vomiting.  Endocrine: Negative for cold intolerance and heat intolerance.   Genitourinary: Negative.  Negative for difficulty urinating, scrotal swelling and testicular pain.  Musculoskeletal: Negative.  Negative for arthralgias.  Skin: Negative.   Neurological: Negative.  Negative for dizziness, weakness, light-headedness and headaches.  Hematological:  Negative for adenopathy. Does not bruise/bleed easily.  Psychiatric/Behavioral: Negative.  Negative for confusion, decreased concentration and suicidal ideas. The patient is not nervous/anxious.    Objective:  BP 124/82 (BP Location: Left Arm, Patient Position: Sitting, Cuff Size: Large)   Pulse 70   Temp 97.8 F (36.6 C) (Oral)   Resp 16   Ht 6\' 2"  (1.88 m)   Wt 225 lb (102.1 kg)   SpO2 98%   BMI 28.89 kg/m   BP Readings from Last 3 Encounters:  04/17/21 124/82  03/02/21 118/78  01/23/21 122/78    Wt Readings from Last 3 Encounters:  04/17/21 225 lb (102.1 kg)  03/02/21 230 lb 9.6 oz (104.6 kg)  01/23/21 229 lb (103.9 kg)    Physical Exam Vitals reviewed.  HENT:     Nose: Nose normal.     Mouth/Throat:     Mouth: Mucous membranes are moist.  Eyes:     General: No scleral icterus.    Conjunctiva/sclera: Conjunctivae normal.  Cardiovascular:     Rate and Rhythm: Normal rate and regular rhythm.     Heart sounds: No murmur heard. Pulmonary:     Effort: Pulmonary effort is normal.     Breath sounds: No stridor. No wheezing, rhonchi or rales.  Abdominal:     General: Abdomen  is flat.     Palpations: There is no mass.     Tenderness: There is no abdominal tenderness. There is no guarding.     Hernia: No hernia is present.  Musculoskeletal:        General: Normal range of motion.     Cervical back: Neck supple.     Right lower leg: No edema.     Left lower leg: No edema.  Lymphadenopathy:     Cervical: No cervical adenopathy.  Skin:    General: Skin is dry.     Coloration: Skin is not pale.  Neurological:     General: No focal deficit present.     Mental Status: He is alert.   Psychiatric:        Mood and Affect: Mood normal.        Behavior: Behavior normal.    Lab Results  Component Value Date   WBC 4.1 03/02/2021   HGB 17.0 03/02/2021   HCT 49.3 03/02/2021   PLT 150.0 03/02/2021   GLUCOSE 67 (L) 03/02/2021   CHOL 155 04/14/2020   TRIG 80 04/14/2020   HDL 42 04/14/2020   LDLCALC 96 04/14/2020   ALT 26 03/02/2021   AST 28 03/02/2021   NA 139 03/02/2021   K 4.1 03/02/2021   CL 103 03/02/2021   CREATININE 1.14 03/02/2021   BUN 25 (H) 03/02/2021   CO2 29 03/02/2021   TSH 3.42 03/02/2021    VAS [place] UPPER EXTREMITY VENOUS DUPLEX  Result Date: 02/17/2021 UPPER VENOUS STUDY  Patient Name:  Antonio James [facility]  Date of Exam:   02/16/2021 Medical Rec #: [id number]        Accession #:    [id number] Date of Birth: Jul 26, 1986       Patient Gender: M Patient Age:   72Y Exam Location:  [facility] Procedure:      VAS [place] UPPER EXTREMITY VENOUS DUPLEX Referring Phys: [id number] STACY J BURNS --------------------------------------------------------------------------------  Other Indications: Right arm heaviness for the past nine months; "feels like blood pooling". He denies any pain. Risk Factors: None identified. Performing Technologist: Dondra Prader RVT  Examination Guidelines: A complete evaluation includes B-mode imaging, spectral Doppler, color Doppler, and power Doppler as needed of all accessible portions of each vessel. Bilateral testing is considered an integral part of a complete examination. Limited examinations for reoccurring indications may be performed as noted.  Right Findings: +----------+------------+---------+-----------+----------+-------+ RIGHT     CompressiblePhasicitySpontaneousPropertiesSummary +----------+------------+---------+-----------+----------+-------+ IJV           Full       Yes       Yes                      +----------+------------+---------+-----------+----------+-------+ Subclavian               Yes       Yes                       +----------+------------+---------+-----------+----------+-------+ Axillary      Full       Yes       Yes                      +----------+------------+---------+-----------+----------+-------+ Brachial      Full       Yes       Yes                      +----------+------------+---------+-----------+----------+-------+  Radial        Full       Yes       Yes                      +----------+------------+---------+-----------+----------+-------+ Ulnar         Full       Yes       Yes                      +----------+------------+---------+-----------+----------+-------+ Cephalic      Full       Yes       Yes                      +----------+------------+---------+-----------+----------+-------+ Basilic       Full       Yes       Yes                      +----------+------------+---------+-----------+----------+-------+  Left Findings: +----------+------------+---------+-----------+----------+-------+ LEFT      CompressiblePhasicitySpontaneousPropertiesSummary +----------+------------+---------+-----------+----------+-------+ IJV           Full       Yes       Yes                      +----------+------------+---------+-----------+----------+-------+ Subclavian               Yes       Yes                      +----------+------------+---------+-----------+----------+-------+  Summary:  Right: No evidence of deep vein thrombosis in the upper extremity. No evidence of superficial vein thrombosis in the upper extremity. No evidence of thrombosis in the subclavian.  Left: No evidence of thrombosis in the subclavian.  *See table(s) above for measurements and observations.  Diagnosing physician: Nanetta Batty MD Electronically signed by Nanetta Batty MD on 02/17/2021 at 11:26:46 AM.    Final     Assessment & Plan:   Antonio James was seen today for annual exam.  Diagnoses and all orders for this visit:  Routine general medical examination at a health care  facility- Exam completed, vaccines are UTD, no labs or cancer screenings needed, pt ed material was given.  I am having Antonio James maintain his cholecalciferol, multivitamin, clomiPHENE, Omega-3 Fatty Acids (FISH OIL PO), methimazole, lisdexamfetamine, and finasteride.  No orders of the defined types were placed in this encounter.    Follow-up: Return in about 6 months (around 10/18/2021).  Sanda Linger, MD

## 2021-04-17 NOTE — Patient Instructions (Signed)
Health Maintenance, Male Adopting a healthy lifestyle and getting preventive care are important in promoting health and wellness. Ask your health care provider about: The right schedule for you to have regular tests and exams. Things you can do on your own to prevent diseases and keep yourself healthy. What should I know about diet, weight, and exercise? Eat a healthy diet  Eat a diet that includes plenty of vegetables, fruits, low-fat dairy products, and lean protein. Do not eat a lot of foods that are high in solid fats, added sugars, or sodium.  Maintain a healthy weight Body mass index (BMI) is a measurement that can be used to identify possible weight problems. It estimates body fat based on height and weight. Your health care provider can help determine your BMI and help you achieve or maintain ahealthy weight. Get regular exercise Get regular exercise. This is one of the most important things you can do for your health. Most adults should: Exercise for at least 150 minutes each week. The exercise should increase your heart rate and make you sweat (moderate-intensity exercise). Do strengthening exercises at least twice a week. This is in addition to the moderate-intensity exercise. Spend less time sitting. Even light physical activity can be beneficial. Watch cholesterol and blood lipids Have your blood tested for lipids and cholesterol at 36 years of age, then havethis test every 5 years. You may need to have your cholesterol levels checked more often if: Your lipid or cholesterol levels are high. You are older than 36 years of age. You are at high risk for heart disease. What should I know about cancer screening? Many types of cancers can be detected early and may often be prevented. Depending on your health history and family history, you may need to have cancer screening at various ages. This may include screening for: Colorectal cancer. Prostate cancer. Skin cancer. Lung  cancer. What should I know about heart disease, diabetes, and high blood pressure? Blood pressure and heart disease High blood pressure causes heart disease and increases the risk of stroke. This is more likely to develop in people who have high blood pressure readings, are of African descent, or are overweight. Talk with your health care provider about your target blood pressure readings. Have your blood pressure checked: Every 3-5 years if you are 18-39 years of age. Every year if you are 40 years old or older. If you are between the ages of 65 and 75 and are a current or former smoker, ask your health care provider if you should have a one-time screening for abdominal aortic aneurysm (AAA). Diabetes Have regular diabetes screenings. This checks your fasting blood sugar level. Have the screening done: Once every three years after age 45 if you are at a normal weight and have a low risk for diabetes. More often and at a younger age if you are overweight or have a high risk for diabetes. What should I know about preventing infection? Hepatitis B If you have a higher risk for hepatitis B, you should be screened for this virus. Talk with your health care provider to find out if you are at risk forhepatitis B infection. Hepatitis C Blood testing is recommended for: Everyone born from 1945 through 1965. Anyone with known risk factors for hepatitis C. Sexually transmitted infections (STIs) You should be screened each year for STIs, including gonorrhea and chlamydia, if: You are sexually active and are younger than 36 years of age. You are older than 36 years of age   and your health care provider tells you that you are at risk for this type of infection. Your sexual activity has changed since you were last screened, and you are at increased risk for chlamydia or gonorrhea. Ask your health care provider if you are at risk. Ask your health care provider about whether you are at high risk for HIV.  Your health care provider may recommend a prescription medicine to help prevent HIV infection. If you choose to take medicine to prevent HIV, you should first get tested for HIV. You should then be tested every 3 months for as long as you are taking the medicine. Follow these instructions at home: Lifestyle Do not use any products that contain nicotine or tobacco, such as cigarettes, e-cigarettes, and chewing tobacco. If you need help quitting, ask your health care provider. Do not use street drugs. Do not share needles. Ask your health care provider for help if you need support or information about quitting drugs. Alcohol use Do not drink alcohol if your health care provider tells you not to drink. If you drink alcohol: Limit how much you have to 0-2 drinks a day. Be aware of how much alcohol is in your drink. In the [place] one drink equals one 12 oz bottle of beer (355 mL), one 5 oz glass of wine (148 mL), or one 1 oz glass of hard liquor (44 mL). General instructions Schedule regular health, dental, and eye exams. Stay current with your vaccines. Tell your health care provider if: You often feel depressed. You have ever been abused or do not feel safe at home. Summary Adopting a healthy lifestyle and getting preventive care are important in promoting health and wellness. Follow your health care provider's instructions about healthy diet, exercising, and getting tested or screened for diseases. Follow your health care provider's instructions on monitoring your cholesterol and blood pressure. This information is not intended to replace advice given to you by your health care provider. Make sure you discuss any questions you have with your healthcare provider. Document Revised: 08/13/2018 Document Reviewed: 08/13/2018 Elsevier Patient Education  2022 [employer]  

## 2021-04-27 ENCOUNTER — Other Ambulatory Visit: Payer: 59

## 2021-05-04 ENCOUNTER — Other Ambulatory Visit: Payer: Self-pay

## 2021-05-04 ENCOUNTER — Other Ambulatory Visit (INDEPENDENT_AMBULATORY_CARE_PROVIDER_SITE_OTHER): Payer: 59

## 2021-05-04 DIAGNOSIS — E0590 Thyrotoxicosis, unspecified without thyrotoxic crisis or storm: Secondary | ICD-10-CM

## 2021-05-04 LAB — TSH: TSH: 3.46 u[IU]/mL (ref 0.35–5.50)

## 2021-05-04 LAB — T4, FREE: Free T4: 1 ng/dL (ref 0.60–1.60)

## 2021-05-11 ENCOUNTER — Other Ambulatory Visit (HOSPITAL_COMMUNITY): Payer: Self-pay

## 2021-05-11 ENCOUNTER — Other Ambulatory Visit: Payer: Self-pay | Admitting: Internal Medicine

## 2021-05-11 DIAGNOSIS — F909 Attention-deficit hyperactivity disorder, unspecified type: Secondary | ICD-10-CM

## 2021-05-12 ENCOUNTER — Other Ambulatory Visit: Payer: Self-pay | Admitting: Internal Medicine

## 2021-05-12 ENCOUNTER — Other Ambulatory Visit (HOSPITAL_COMMUNITY): Payer: Self-pay

## 2021-05-12 DIAGNOSIS — F909 Attention-deficit hyperactivity disorder, unspecified type: Secondary | ICD-10-CM

## 2021-05-14 MED ORDER — LISDEXAMFETAMINE DIMESYLATE 40 MG PO CAPS
40.0000 mg | ORAL_CAPSULE | Freq: Every day | ORAL | 0 refills | Status: DC | PRN
  Filled 2021-05-14: qty 90, 90d supply, fill #0

## 2021-05-15 ENCOUNTER — Other Ambulatory Visit (HOSPITAL_COMMUNITY): Payer: Self-pay

## 2021-07-05 DIAGNOSIS — E291 Testicular hypofunction: Secondary | ICD-10-CM | POA: Diagnosis not present

## 2021-07-05 DIAGNOSIS — Z3009 Encounter for other general counseling and advice on contraception: Secondary | ICD-10-CM | POA: Diagnosis not present

## 2021-07-06 ENCOUNTER — Ambulatory Visit: Payer: 59 | Admitting: Internal Medicine

## 2021-07-06 NOTE — Progress Notes (Deleted)
Name: Antonio James  MRN/ DOB: [id number], 1985/09/04    Age/ Sex: 35 y.o., male     PCP: Etta Grandchild, MD   Reason for Endocrinology Evaluation: Hyperthyroidism     Initial Endocrinology Clinic Visit: 07/27/2020    PATIENT IDENTIFIER: [name] is a 35 y.o., male with a past medical history of ADHD and hyperthyroidism. He has followed with [facility] since 07/27/2020 for consultative assistance with management of his Hyperthyroidism.   HISTORICAL SUMMARY: The patient was first diagnosed with hyperthyroidism in 07/2020 TSH at 0.01 uIU/ML with elevated total T4 at 11.5 mcg/dL.   TRAb elevated at 24.63 IU/L   Methimazole started 07/2020    No FH of thyroid disease  SUBJECTIVE:    Today (07/06/2021):  Mr. [name] is here for a follow up on hyperthyroidism secondary to Graves' Disease    Has an eye exam in 08/2020 with dry eye syndrome - eyes have been better  He has been noted with weight gain  Denies fever  Denies constipation or abdominal pain  Denies local neck symptoms    Expecting baby #3 in 06/2021   Methimazole 5 mg, 1.5 tabs daily    HISTORY:  Past Medical History:  Past Medical History:  Diagnosis Date   Adult ADHD    Depression    counseling throughout his life, no meds   GERD (gastroesophageal reflux disease)    Left hip pain    Intermittent, has known torn labrum in left hip   Panic attacks    Situational anxiety    Past Surgical History:  Past Surgical History:  Procedure Laterality Date   WISDOM TOOTH EXTRACTION     No complications   Social History:  reports that he has quit smoking. His smoking use included e-cigarettes. He has quit using smokeless tobacco. He reports current alcohol use. He reports that he does not use drugs. Family History:  Family History  Problem Relation Age of Onset   Cancer Mother        breast   Cancer Maternal Grandmother    Heart disease Maternal Grandfather    Stroke Maternal  Grandfather    Diabetes Paternal Grandmother    Heart attack Paternal Grandfather    Other Paternal Grandfather        hypogonadism     HOME MEDICATIONS: Allergies as of 07/06/2021       Reactions   Sulfa Antibiotics Hives, Rash        Medication List        Accurate as of July 06, 2021  6:50 AM. If you have any questions, ask your nurse or doctor.          cholecalciferol 1000 units tablet Commonly known as: VITAMIN D Take 1,000 Units by mouth daily.   clomiPHENE 50 MG tablet Commonly known as: CLOMID Take 25 mg by mouth every other day.   finasteride 1 MG tablet Commonly known as: PROPECIA Take 1 mg by mouth daily.   FISH OIL PO Take 15 mLs by mouth daily.   methimazole 5 MG tablet Commonly known as: TAPAZOLE TAKE 1 TABLET BY MOUTH TWICE DAILY. What changed: when to take this   multivitamin Tabs tablet Take 1 tablet by mouth daily.   Vyvanse 40 MG capsule Generic drug: lisdexamfetamine Take 1 capsule (40 mg total) by mouth daily as needed.          OBJECTIVE:   PHYSICAL EXAM: VS: There were no vitals taken for  this visit.   EXAM: General: Pt appears well and is in NAD  Eyes: External eye exam normal without stare, lid lag or exophthalmos.  EOM intact.    Neck: General: Supple without adenopathy. Thyroid: Thyroid size normal.  No goiter or nodules appreciated. No thyroid bruit.  Lungs: Clear with good BS bilat with no rales, rhonchi, or wheezes  Heart: Auscultation: RRR.  Abdomen: Normoactive bowel sounds, soft, nontender, without masses or organomegaly palpable  Extremities:  BL LE: No pretibial edema normal ROM and strength.  Mental Status: Judgment, insight: Intact Orientation: Oriented to time, place, and person Mood and affect: No depression, anxiety, or agitation     DATA REVIEWED: Results for Antonio James, Antonio James (MRN [id number]) as of 03/03/2021 08:08  Ref. Range 03/02/2021 07:48  Sodium Latest Ref Range: 135 - 145 mEq/L 139   Potassium Latest Ref Range: 3.5 - 5.1 mEq/L 4.1  Chloride Latest Ref Range: 96 - 112 mEq/L 103  CO2 Latest Ref Range: 19 - 32 mEq/L 29  Glucose Latest Ref Range: 70 - 99 mg/dL 67 (L)  BUN Latest Ref Range: 6 - 23 mg/dL 25 (H)  Creatinine Latest Ref Range: 0.40 - 1.50 mg/dL 7.82  Calcium Latest Ref Range: 8.4 - 10.5 mg/dL 9.4  Alkaline Phosphatase Latest Ref Range: 39 - 117 U/L 57  Albumin Latest Ref Range: 3.5 - 5.2 g/dL 4.5  AST Latest Ref Range: 0 - 37 U/L 28  ALT Latest Ref Range: 0 - 53 U/L 26  Total Protein Latest Ref Range: 6.0 - 8.3 g/dL 7.1  Total Bilirubin Latest Ref Range: 0.2 - 1.2 mg/dL 0.7  GFR Latest Ref Range: >60.00 mL/min 83.21  WBC Latest Ref Range: 4.0 - 10.5 K/uL 4.1  RBC Latest Ref Range: 4.22 - 5.81 Mil/uL 5.64  Hemoglobin Latest Ref Range: 13.0 - 17.0 g/dL 95.6  HCT Latest Ref Range: 39.0 - 52.0 % 49.3  MCV Latest Ref Range: 78.0 - 100.0 fl 87.5  MCHC Latest Ref Range: 30.0 - 36.0 g/dL 21.3  RDW Latest Ref Range: 11.5 - 15.5 % 13.6  Platelets Latest Ref Range: 150.0 - 400.0 K/uL 150.0  Neutrophils Latest Ref Range: 43.0 - 77.0 % 52.1  Lymphocytes Latest Ref Range: 12.0 - 46.0 % 37.1  Monocytes Relative Latest Ref Range: 3.0 - 12.0 % 8.5  Eosinophil Latest Ref Range: 0.0 - 5.0 % 1.7  Basophil Latest Ref Range: 0.0 - 3.0 % 0.6  NEUT# Latest Ref Range: 1.4 - 7.7 K/uL 2.1  Lymphocyte # Latest Ref Range: 0.7 - 4.0 K/uL 1.5  Monocyte # Latest Ref Range: 0.1 - 1.0 K/uL 0.3  Eosinophils Absolute Latest Ref Range: 0.0 - 0.7 K/uL 0.1  Basophils Absolute Latest Ref Range: 0.0 - 0.1 K/uL 0.0  TSH Latest Ref Range: 0.35 - 5.50 uIU/mL 3.42  T4,Free(Direct) Latest Ref Range: 0.60 - 1.60 ng/dL 0.86     ASSESSMENT / PLAN / RECOMMENDATIONS:   Hyperthyroidism secondary to Graves' Disease  - Pt is clinically euthyroid  - No local neck symptoms - Tolerating Methimazole  - TFT's are normal, no changes    Medications  Methimazole 5 mg, 1.5 tabs daily      2.  Graves' Disease:  - No extra thyroidal manifestations of Graves' disease     F/U in 4 months  Labs in 8 weeks     Signed electronically by: Lyndle Herrlich, MD  [facility]  [facility] [address] [place], [place] [address]  Phone: [phone] FAX: [phone]      CC: Etta Grandchild, MD [address] [place] [address] Phone: [phone]  Fax: [phone]   Return to Endocrinology clinic as below: Future Appointments  Date Time Provider Department Center  07/06/2021  7:30 AM Arron Mcnaught, Konrad Dolores, MD [id number] None

## 2021-08-01 ENCOUNTER — Other Ambulatory Visit (HOSPITAL_COMMUNITY): Payer: Self-pay

## 2021-08-01 MED ORDER — CLOMIPHENE CITRATE 50 MG PO TABS
50.0000 mg | ORAL_TABLET | Freq: Every day | ORAL | 11 refills | Status: AC
  Filled 2021-08-01 – 2021-08-25 (×3): qty 30, 30d supply, fill #0
  Filled 2022-02-14: qty 30, 30d supply, fill #1

## 2021-08-02 ENCOUNTER — Other Ambulatory Visit (HOSPITAL_COMMUNITY): Payer: Self-pay

## 2021-08-10 ENCOUNTER — Other Ambulatory Visit (HOSPITAL_COMMUNITY): Payer: Self-pay

## 2021-08-18 ENCOUNTER — Encounter: Payer: Self-pay | Admitting: Internal Medicine

## 2021-08-22 ENCOUNTER — Other Ambulatory Visit (HOSPITAL_COMMUNITY): Payer: Self-pay

## 2021-08-22 ENCOUNTER — Other Ambulatory Visit: Payer: Self-pay | Admitting: Internal Medicine

## 2021-08-22 DIAGNOSIS — E291 Testicular hypofunction: Secondary | ICD-10-CM | POA: Diagnosis not present

## 2021-08-22 DIAGNOSIS — F909 Attention-deficit hyperactivity disorder, unspecified type: Secondary | ICD-10-CM

## 2021-08-22 DIAGNOSIS — Z125 Encounter for screening for malignant neoplasm of prostate: Secondary | ICD-10-CM | POA: Diagnosis not present

## 2021-08-22 MED ORDER — LISDEXAMFETAMINE DIMESYLATE 40 MG PO CAPS
40.0000 mg | ORAL_CAPSULE | Freq: Every day | ORAL | 0 refills | Status: DC | PRN
  Filled 2021-08-22: qty 90, 90d supply, fill #0

## 2021-08-25 ENCOUNTER — Other Ambulatory Visit (HOSPITAL_COMMUNITY): Payer: Self-pay

## 2021-09-04 ENCOUNTER — Other Ambulatory Visit (HOSPITAL_COMMUNITY): Payer: Self-pay

## 2021-11-17 DIAGNOSIS — Z79890 Hormone replacement therapy (postmenopausal): Secondary | ICD-10-CM | POA: Diagnosis not present

## 2021-11-17 DIAGNOSIS — E291 Testicular hypofunction: Secondary | ICD-10-CM | POA: Diagnosis not present

## 2022-02-14 ENCOUNTER — Telehealth: Payer: Self-pay

## 2022-02-14 ENCOUNTER — Other Ambulatory Visit: Payer: Self-pay | Admitting: Internal Medicine

## 2022-02-14 ENCOUNTER — Other Ambulatory Visit (HOSPITAL_COMMUNITY): Payer: Self-pay

## 2022-02-14 DIAGNOSIS — F909 Attention-deficit hyperactivity disorder, unspecified type: Secondary | ICD-10-CM

## 2022-02-14 MED ORDER — METHIMAZOLE 5 MG PO TABS
5.0000 mg | ORAL_TABLET | Freq: Two times a day (BID) | ORAL | 0 refills | Status: DC
  Filled 2022-02-14: qty 120, 60d supply, fill #0

## 2022-02-14 NOTE — Telephone Encounter (Signed)
Patient would like to get labs checked before his appointment.Antonio James

## 2022-02-14 NOTE — Telephone Encounter (Signed)
Office visit has been scheduled for 02/28/22

## 2022-02-15 ENCOUNTER — Other Ambulatory Visit (HOSPITAL_COMMUNITY): Payer: Self-pay

## 2022-02-15 ENCOUNTER — Other Ambulatory Visit: Payer: Self-pay | Admitting: Internal Medicine

## 2022-02-15 DIAGNOSIS — F909 Attention-deficit hyperactivity disorder, unspecified type: Secondary | ICD-10-CM

## 2022-02-15 MED ORDER — LISDEXAMFETAMINE DIMESYLATE 40 MG PO CAPS
40.0000 mg | ORAL_CAPSULE | Freq: Every day | ORAL | 0 refills | Status: DC | PRN
  Filled 2022-02-15: qty 90, 90d supply, fill #0

## 2022-02-28 ENCOUNTER — Encounter: Payer: Self-pay | Admitting: Internal Medicine

## 2022-02-28 ENCOUNTER — Ambulatory Visit (INDEPENDENT_AMBULATORY_CARE_PROVIDER_SITE_OTHER): Payer: 59 | Admitting: Internal Medicine

## 2022-02-28 VITALS — BP 122/80 | HR 67 | Ht 74.0 in | Wt 232.4 lb

## 2022-02-28 DIAGNOSIS — E0500 Thyrotoxicosis with diffuse goiter without thyrotoxic crisis or storm: Secondary | ICD-10-CM

## 2022-02-28 DIAGNOSIS — E0590 Thyrotoxicosis, unspecified without thyrotoxic crisis or storm: Secondary | ICD-10-CM | POA: Diagnosis not present

## 2022-02-28 DIAGNOSIS — D72819 Decreased white blood cell count, unspecified: Secondary | ICD-10-CM | POA: Diagnosis not present

## 2022-02-28 LAB — COMPREHENSIVE METABOLIC PANEL
ALT: 28 U/L (ref 0–53)
AST: 27 U/L (ref 0–37)
Albumin: 4.4 g/dL (ref 3.5–5.2)
Alkaline Phosphatase: 51 U/L (ref 39–117)
BUN: 27 mg/dL — ABNORMAL HIGH (ref 6–23)
CO2: 27 mEq/L (ref 19–32)
Calcium: 9.6 mg/dL (ref 8.4–10.5)
Chloride: 104 mEq/L (ref 96–112)
Creatinine, Ser: 1.12 mg/dL (ref 0.40–1.50)
GFR: 84.4 mL/min (ref >60.00)
Glucose, Bld: 77 mg/dL (ref 70–99)
Potassium: 4.1 mEq/L (ref 3.5–5.1)
Sodium: 138 mEq/L (ref 135–145)
Total Bilirubin: 0.7 mg/dL (ref 0.2–1.2)
Total Protein: 7 g/dL (ref 6.0–8.3)

## 2022-02-28 LAB — CBC WITH DIFFERENTIAL/PLATELET
Basophils Absolute: 0 10*3/uL (ref 0.0–0.1)
Basophils Relative: 0.5 % (ref 0.0–3.0)
Eosinophils Absolute: 0 10*3/uL (ref 0.0–0.7)
Eosinophils Relative: 1.1 % (ref 0.0–5.0)
HCT: 50.8 % (ref 39.0–52.0)
Hemoglobin: 17.1 g/dL — ABNORMAL HIGH (ref 13.0–17.0)
Lymphocytes Relative: 33.7 % (ref 12.0–46.0)
Lymphs Abs: 1.3 10*3/uL (ref 0.7–4.0)
MCHC: 33.7 g/dL (ref 30.0–36.0)
MCV: 88.8 fl (ref 78.0–100.0)
Monocytes Absolute: 0.3 10*3/uL (ref 0.1–1.0)
Monocytes Relative: 7.9 % (ref 3.0–12.0)
Neutro Abs: 2.2 10*3/uL (ref 1.4–7.7)
Neutrophils Relative %: 56.8 % (ref 43.0–77.0)
Platelets: 159 10*3/uL (ref 150.0–400.0)
RBC: 5.72 Mil/uL (ref 4.22–5.81)
RDW: 13.3 % (ref 11.5–15.5)
WBC: 3.9 10*3/uL — ABNORMAL LOW (ref 4.0–10.5)

## 2022-02-28 LAB — T4, FREE: Free T4: 1.14 ng/dL (ref 0.60–1.60)

## 2022-02-28 LAB — TSH: TSH: 2.38 u[IU]/mL (ref 0.35–5.50)

## 2022-02-28 LAB — T3: T3, Total: 82 ng/dL (ref 76–181)

## 2022-02-28 NOTE — Progress Notes (Signed)
Name: Antonio James  MRN/ DOB: [id number], 1985/12/16    Age/ Sex: 36 years old, male     PCP: Janith Lima, MD   Reason for Endocrinology Evaluation: Hyperthyroidism     Initial Endocrinology Clinic Visit: 07/27/2020    PATIENT IDENTIFIER: [name] is a 36 y.o., male with a past medical history of ADHD and hyperthyroidism. He has followed with [facility] since 07/27/2020 for consultative assistance with management of his Hyperthyroidism.   HISTORICAL SUMMARY: The patient was first diagnosed with hyperthyroidism in 07/2020 TSH at 0.01 uIU/ML with elevated total T4 at 11.5 mcg/dL.   TRAb elevated at 24.63 IU/L   Methimazole started 07/2020    No FH of thyroid disease  SUBJECTIVE:    Today (02/28/2022):  Antonio James is here for a follow up on hyperthyroidism secondary to Graves' Disease.  The patient has not been to our clinic in a year.   He has been noted with weight gain  Denies constipation or abdominal pain  Denies local neck symptoms  Denies eye symptoms     Methimazole 5 mg, half a tablet  daily    HISTORY:  Past Medical History:  Past Medical History:  Diagnosis Date   Adult ADHD    Depression    counseling throughout his life, no meds   GERD (gastroesophageal reflux disease)    Left hip pain    Intermittent, has known torn labrum in left hip   Panic attacks    Situational anxiety    Past Surgical History:  Past Surgical History:  Procedure Laterality Date   WISDOM TOOTH EXTRACTION     No complications   Social History:  reports that he has quit smoking. His smoking use included e-cigarettes. He has quit using smokeless tobacco. He reports current alcohol use. He reports that he does not use drugs. Family History:  Family History  Problem Relation Age of Onset   Cancer Mother        breast   Cancer Maternal Grandmother    Heart disease Maternal Grandfather    Stroke Maternal Grandfather    Diabetes Paternal  Grandmother    Heart attack Paternal Grandfather    Other Paternal Grandfather        hypogonadism     HOME MEDICATIONS: Allergies as of 02/28/2022       Reactions   Sulfa Antibiotics Hives, Rash        Medication List        Accurate as of February 28, 2022  7:36 AM. If you have any questions, ask your nurse or doctor.          cholecalciferol 1000 units tablet Commonly known as: VITAMIN D Take 1,000 Units by mouth daily.   Clomid 50 MG tablet Generic drug: clomiPHENE Take 1 tablet (50 mg total) by mouth daily. What changed: Another medication with the same name was removed. Continue taking this medication, and follow the directions you see here. Changed by: Dorita Sciara, MD   finasteride 1 MG tablet Commonly known as: PROPECIA Take 1 mg by mouth daily.   FISH OIL PO Take 15 mLs by mouth daily.   methimazole 5 MG tablet Commonly known as: TAPAZOLE Take 1 tablet (5 mg total) by mouth 2 (two) times daily. What changed:  how much to take when to take this   multivitamin Tabs tablet Take 1 tablet by mouth daily.   Vyvanse 40 MG capsule Generic drug: lisdexamfetamine Take 1 capsule (  40 mg total) by mouth daily as needed.          OBJECTIVE:   PHYSICAL EXAM: VS: BP 122/80 (BP Location: Left Arm, Patient Position: Sitting, Cuff Size: Large)   Pulse 67   Ht 6\' 2"  (1.88 m)   Wt 232 lb 6.4 oz (105.4 kg)   SpO2 96%   BMI 29.84 kg/m    EXAM: General: Pt appears well and is in NAD  Eyes: External eye exam normal without stare, lid lag or exophthalmos.  EOM intact.    Neck: General: Supple without adenopathy. Thyroid: Thyroid size normal.  No goiter or nodules appreciated. No thyroid bruit.  Lungs: Clear with good BS bilat with no rales, rhonchi, or wheezes  Heart: Auscultation: RRR.  Abdomen: Normoactive bowel sounds, soft, nontender, without masses or organomegaly palpable  Extremities:  BL LE: No pretibial edema normal ROM and strength.   Mental Status: Judgment, insight: Intact Orientation: Oriented to time, place, and person Mood and affect: No depression, anxiety, or agitation     DATA REVIEWED:   Latest Reference Range & Units 02/28/22 07:50  Sodium 135 - 145 mEq/L 138  Potassium 3.5 - 5.1 mEq/L 4.1  Chloride 96 - 112 mEq/L 104  CO2 19 - 32 mEq/L 27  Glucose 70 - 99 mg/dL 77  BUN 6 - 23 mg/dL 27 (H)  Creatinine 03/02/22 - 1.50 mg/dL 2.84  Calcium 8.4 - 1.32 mg/dL 9.6  Alkaline Phosphatase 39 - 117 U/L 51  Albumin 3.5 - 5.2 g/dL 4.4  AST 0 - 37 U/L 27  ALT 0 - 53 U/L 28  Total Protein 6.0 - 8.3 g/dL 7.0  Total Bilirubin 0.2 - 1.2 mg/dL 0.7  GFR 44.0 mL/min 84.40    Latest Reference Range & Units 02/28/22 07:50  WBC 4.0 - 10.5 K/uL 3.9 (L)  RBC 4.22 - 5.81 Mil/uL 5.72  Hemoglobin 13.0 - 17.0 g/dL 03/02/22 (H)  HCT 25.3 - 66.4 % 50.8  MCV 78.0 - 100.0 fl 88.8  MCHC 30.0 - 36.0 g/dL 40.3  RDW 47.4 - 25.9 % 13.3  Platelets 150.0 - 400.0 K/uL 159.0  Neutrophils 43.0 - 77.0 % 56.8  Lymphocytes 12.0 - 46.0 % 33.7  Monocytes Relative 3.0 - 12.0 % 7.9  Eosinophil 0.0 - 5.0 % 1.1  Basophil 0.0 - 3.0 % 0.5  NEUT# 1.4 - 7.7 K/uL 2.2  Lymphocyte # 0.7 - 4.0 K/uL 1.3  Monocyte # 0.1 - 1.0 K/uL 0.3  Eosinophils Absolute 0.0 - 0.7 K/uL 0.0  Basophils Absolute 0.0 - 0.1 K/uL 0.0  Glucose 70 - 99 mg/dL 77    Latest Reference Range & Units 02/28/22 07:50  TSH 0.35 - 5.50 uIU/mL 2.38  T4,Free(Direct) 0.60 - 1.60 ng/dL 03/02/22    ASSESSMENT / PLAN / RECOMMENDATIONS:   Hyperthyroidism secondary to Graves' Disease  - Pt is clinically euthyroid  - No local neck symptoms - Tolerating Methimazole  - TFT's are normal, no changes    Medications  Methimazole 5 mg, half tabs daily      2. Graves' Disease:  - No extra thyroidal manifestations of Graves' disease    3. Leukopenia:  - Neutrophil count is normal , will continue to monitor    F/U in 6 months  Labs in 8 weeks     Signed electronically  by: 8.75, MD  [facility]  [facility] [address] [place], [place] Phone: [phone] FAX: [phone]  CC: Etta Grandchild, MD [address] [place] [address] Phone: [phone]  Fax: [phone]   Return to Endocrinology clinic as below: Future Appointments  Date Time Provider Department Center  04/19/2022  8:00 AM Etta Grandchild, MD [facility] None

## 2022-04-19 ENCOUNTER — Ambulatory Visit (INDEPENDENT_AMBULATORY_CARE_PROVIDER_SITE_OTHER): Payer: 59 | Admitting: Internal Medicine

## 2022-04-19 ENCOUNTER — Encounter: Payer: Self-pay | Admitting: Internal Medicine

## 2022-04-19 VITALS — BP 118/64 | HR 62 | Temp 98.0°F | Ht 74.0 in | Wt 227.0 lb

## 2022-04-19 DIAGNOSIS — Z0000 Encounter for general adult medical examination without abnormal findings: Secondary | ICD-10-CM

## 2022-04-19 DIAGNOSIS — F909 Attention-deficit hyperactivity disorder, unspecified type: Secondary | ICD-10-CM

## 2022-04-19 DIAGNOSIS — E0500 Thyrotoxicosis with diffuse goiter without thyrotoxic crisis or storm: Secondary | ICD-10-CM

## 2022-04-19 LAB — LIPID PANEL
Cholesterol: 206 mg/dL — ABNORMAL HIGH (ref 0–200)
HDL: 50.8 mg/dL (ref >39.00)
LDL Cholesterol: 137 mg/dL — ABNORMAL HIGH (ref 0–99)
NonHDL: 154.87
Total CHOL/HDL Ratio: 4
Triglycerides: 88 mg/dL (ref 0.0–149.0)
VLDL: 17.6 mg/dL (ref 0.0–40.0)

## 2022-04-19 LAB — TSH: TSH: 2.8 u[IU]/mL (ref 0.35–5.50)

## 2022-04-19 NOTE — Patient Instructions (Signed)
Health Maintenance, Male Adopting a healthy lifestyle and getting preventive care are important in promoting health and wellness. Ask your health care provider about: The right schedule for you to have regular tests and exams. Things you can do on your own to prevent diseases and keep yourself healthy. What should I know about diet, weight, and exercise? Eat a healthy diet  Eat a diet that includes plenty of vegetables, fruits, low-fat dairy products, and lean protein. Do not eat a lot of foods that are high in solid fats, added sugars, or sodium. Maintain a healthy weight Body mass index (BMI) is a measurement that can be used to identify possible weight problems. It estimates body fat based on height and weight. Your health care provider can help determine your BMI and help you achieve or maintain a healthy weight. Get regular exercise Get regular exercise. This is one of the most important things you can do for your health. Most adults should: Exercise for at least 150 minutes each week. The exercise should increase your heart rate and make you sweat (moderate-intensity exercise). Do strengthening exercises at least twice a week. This is in addition to the moderate-intensity exercise. Spend less time sitting. Even light physical activity can be beneficial. Watch cholesterol and blood lipids Have your blood tested for lipids and cholesterol at 37 years of age, then have this test every 5 years. You may need to have your cholesterol levels checked more often if: Your lipid or cholesterol levels are high. You are older than 37 years of age. You are at high risk for heart disease. What should I know about cancer screening? Many types of cancers can be detected early and may often be prevented. Depending on your health history and family history, you may need to have cancer screening at various ages. This may include screening for: Colorectal cancer. Prostate cancer. Skin cancer. Lung  cancer. What should I know about heart disease, diabetes, and high blood pressure? Blood pressure and heart disease High blood pressure causes heart disease and increases the risk of stroke. This is more likely to develop in people who have high blood pressure readings or are overweight. Talk with your health care provider about your target blood pressure readings. Have your blood pressure checked: Every 3-5 years if you are 18-39 years of age. Every year if you are 40 years old or older. If you are between the ages of 65 and 75 and are a current or former smoker, ask your health care provider if you should have a one-time screening for abdominal aortic aneurysm (AAA). Diabetes Have regular diabetes screenings. This checks your fasting blood sugar level. Have the screening done: Once every three years after age 45 if you are at a normal weight and have a low risk for diabetes. More often and at a younger age if you are overweight or have a high risk for diabetes. What should I know about preventing infection? Hepatitis B If you have a higher risk for hepatitis B, you should be screened for this virus. Talk with your health care provider to find out if you are at risk for hepatitis B infection. Hepatitis C Blood testing is recommended for: Everyone born from 1945 through 1965. Anyone with known risk factors for hepatitis C. Sexually transmitted infections (STIs) You should be screened each year for STIs, including gonorrhea and chlamydia, if: You are sexually active and are younger than 37 years of age. You are older than 37 years of age and your   health care provider tells you that you are at risk for this type of infection. Your sexual activity has changed since you were last screened, and you are at increased risk for chlamydia or gonorrhea. Ask your health care provider if you are at risk. Ask your health care provider about whether you are at high risk for HIV. Your health care provider  may recommend a prescription medicine to help prevent HIV infection. If you choose to take medicine to prevent HIV, you should first get tested for HIV. You should then be tested every 3 months for as long as you are taking the medicine. Follow these instructions at home: Alcohol use Do not drink alcohol if your health care provider tells you not to drink. If you drink alcohol: Limit how much you have to 0-2 drinks a day. Know how much alcohol is in your drink. In the [place] one drink equals one 12 oz bottle of beer (355 mL), one 5 oz glass of wine (148 mL), or one 1 oz glass of hard liquor (44 mL). Lifestyle Do not use any products that contain nicotine or tobacco. These products include cigarettes, chewing tobacco, and vaping devices, such as e-cigarettes. If you need help quitting, ask your health care provider. Do not use street drugs. Do not share needles. Ask your health care provider for help if you need support or information about quitting drugs. General instructions Schedule regular health, dental, and eye exams. Stay current with your vaccines. Tell your health care provider if: You often feel depressed. You have ever been abused or do not feel safe at home. Summary Adopting a healthy lifestyle and getting preventive care are important in promoting health and wellness. Follow your health care provider's instructions about healthy diet, exercising, and getting tested or screened for diseases. Follow your health care provider's instructions on monitoring your cholesterol and blood pressure. This information is not intended to replace advice given to you by your health care provider. Make sure you discuss any questions you have with your health care provider. Document Revised: 01/09/2021 Document Reviewed: 01/09/2021 Elsevier Patient Education  2023 [employer]  

## 2022-04-19 NOTE — Progress Notes (Signed)
Subjective:  Patient ID: Antonio James, male    DOB: Jan 12, 1986  Age: 36 y.o. MRN: [id number]  CC: Annual Exam   HPI CARMICHAEL HOPPS presents for a CPX and f/up -  He has felt well recently and offers no complaints.  He is using Vyvanse as needed and tolerates it well with no side effects.  Outpatient Medications Prior to Visit  Medication Sig Dispense Refill   cholecalciferol (VITAMIN D) 1000 UNITS tablet Take 1,000 Units by mouth daily.     clomiPHENE (CLOMID) 50 MG tablet Take 1 tablet (50 mg total) by mouth daily. 30 tablet 11   finasteride (PROPECIA) 1 MG tablet Take 1 mg by mouth daily.     methimazole (TAPAZOLE) 5 MG tablet Take 1 tablet (5 mg total) by mouth 2 (two) times daily. (Patient taking differently: Take 2.5 mg by mouth daily.) 120 tablet 0   multivitamin (ONE-A-DAY MEN'S) TABS tablet Take 1 tablet by mouth daily.     Omega-3 Fatty Acids (FISH OIL PO) Take 15 mLs by mouth daily.     lisdexamfetamine (VYVANSE) 40 MG capsule Take 1 capsule (40 mg total) by mouth daily as needed. 90 capsule 0   No facility-administered medications prior to visit.    ROS Review of Systems  Constitutional:  Negative for diaphoresis and fatigue.  HENT: Negative.    Eyes: Negative.   Respiratory:  Negative for cough, chest tightness, shortness of breath and wheezing.   Cardiovascular:  Negative for chest pain, palpitations and leg swelling.  Gastrointestinal:  Negative for abdominal pain, diarrhea, nausea and vomiting.  Endocrine: Negative.  Negative for cold intolerance and heat intolerance.  Genitourinary: Negative.  Negative for scrotal swelling and testicular pain.  Musculoskeletal: Negative.   Skin: Negative.   Neurological: Negative.  Negative for dizziness and weakness.  Hematological:  Negative for adenopathy. Does not bruise/bleed easily.  Psychiatric/Behavioral: Negative.      Objective:  BP 118/64 (BP Location: Left Arm, Patient Position: Sitting, Cuff Size: Large)    Pulse 62   Temp 98 F (36.7 C) (Oral)   Ht 6\' 2"  (1.88 m)   Wt 227 lb (103 kg)   SpO2 98%   BMI 29.15 kg/m   BP Readings from Last 3 Encounters:  04/19/22 118/64  02/28/22 122/80  04/17/21 124/82    Wt Readings from Last 3 Encounters:  04/19/22 227 lb (103 kg)  02/28/22 232 lb 6.4 oz (105.4 kg)  04/17/21 225 lb (102.1 kg)    Physical Exam Vitals reviewed.  Constitutional:      Appearance: Normal appearance.  HENT:     Mouth/Throat:     Mouth: Mucous membranes are moist.  Eyes:     General: No scleral icterus.    Conjunctiva/sclera: Conjunctivae normal.  Cardiovascular:     Rate and Rhythm: Normal rate and regular rhythm.     Heart sounds: No murmur heard. Pulmonary:     Effort: Pulmonary effort is normal.     Breath sounds: No stridor. No wheezing, rhonchi or rales.  Abdominal:     General: Abdomen is flat.     Palpations: There is no mass.     Tenderness: There is no abdominal tenderness. There is no guarding.     Hernia: No hernia is present.  Musculoskeletal:        General: Normal range of motion.     Cervical back: Neck supple.     Right lower leg: No edema.     Left  lower leg: No edema.  Lymphadenopathy:     Cervical: No cervical adenopathy.  Skin:    General: Skin is warm and dry.  Neurological:     General: No focal deficit present.  Psychiatric:        Mood and Affect: Mood normal.        Behavior: Behavior normal.     Lab Results  Component Value Date   WBC 3.9 (L) 02/28/2022   HGB 17.1 (H) 02/28/2022   HCT 50.8 02/28/2022   PLT 159.0 02/28/2022   GLUCOSE 77 02/28/2022   CHOL 206 (H) 04/19/2022   TRIG 88.0 04/19/2022   HDL 50.80 04/19/2022   LDLCALC 137 (H) 04/19/2022   ALT 28 02/28/2022   AST 27 02/28/2022   NA 138 02/28/2022   K 4.1 02/28/2022   CL 104 02/28/2022   CREATININE 1.12 02/28/2022   BUN 27 (H) 02/28/2022   CO2 27 02/28/2022   TSH 2.80 04/19/2022    VAS [place] UPPER EXTREMITY VENOUS DUPLEX  Result Date:  02/17/2021 UPPER VENOUS STUDY  Patient Name:  Antonio James [facility]  Date of Exam:   02/16/2021 Medical Rec #: [id number]        Accession #:    [id number] Date of Birth: 1986/01/26       Patient Gender: M Patient Age:   72Y Exam Location:  [facility] Procedure:      VAS [place] UPPER EXTREMITY VENOUS DUPLEX Referring Phys: [id number] STACY J BURNS --------------------------------------------------------------------------------  Other Indications: Right arm heaviness for the past nine months; "feels like blood pooling". He denies any pain. Risk Factors: None identified. Performing Technologist: Dondra Prader RVT  Examination Guidelines: A complete evaluation includes B-mode imaging, spectral Doppler, color Doppler, and power Doppler as needed of all accessible portions of each vessel. Bilateral testing is considered an integral part of a complete examination. Limited examinations for reoccurring indications may be performed as noted.  Right Findings: +----------+------------+---------+-----------+----------+-------+ RIGHT     CompressiblePhasicitySpontaneousPropertiesSummary +----------+------------+---------+-----------+----------+-------+ IJV           Full       Yes       Yes                      +----------+------------+---------+-----------+----------+-------+ Subclavian               Yes       Yes                      +----------+------------+---------+-----------+----------+-------+ Axillary      Full       Yes       Yes                      +----------+------------+---------+-----------+----------+-------+ Brachial      Full       Yes       Yes                      +----------+------------+---------+-----------+----------+-------+ Radial        Full       Yes       Yes                      +----------+------------+---------+-----------+----------+-------+ Ulnar         Full       Yes       Yes                       +----------+------------+---------+-----------+----------+-------+  Cephalic      Full       Yes       Yes                      +----------+------------+---------+-----------+----------+-------+ Basilic       Full       Yes       Yes                      +----------+------------+---------+-----------+----------+-------+  Left Findings: +----------+------------+---------+-----------+----------+-------+ LEFT      CompressiblePhasicitySpontaneousPropertiesSummary +----------+------------+---------+-----------+----------+-------+ IJV           Full       Yes       Yes                      +----------+------------+---------+-----------+----------+-------+ Subclavian               Yes       Yes                      +----------+------------+---------+-----------+----------+-------+  Summary:  Right: No evidence of deep vein thrombosis in the upper extremity. No evidence of superficial vein thrombosis in the upper extremity. No evidence of thrombosis in the subclavian.  Left: No evidence of thrombosis in the subclavian.  *See table(s) above for measurements and observations.  Diagnosing physician: Nanetta Batty MD Electronically signed by Nanetta Batty MD on 02/17/2021 at 11:26:46 AM.    Final     Assessment & Plan:   Jacier was seen today for annual exam.  Diagnoses and all orders for this visit:  Routine general medical examination at a health care facility- Exam completed, labs reviewed-statin not indicated, vaccines are up-to-date, no cancer screenings indicated, patient education was given. -     Lipid panel; Future -     Lipid panel  Graves disease- He is euthyroid -     TSH; Future -     TSH  Adult ADHD -     lisdexamfetamine (VYVANSE) 40 MG capsule; Take 1 capsule (40 mg total) by mouth daily as needed.   I am having Emric C. Bajorek maintain his cholecalciferol, multivitamin, Omega-3 Fatty Acids (FISH OIL PO), finasteride, clomiPHENE, methimazole, and  lisdexamfetamine.  Meds ordered this encounter  Medications   lisdexamfetamine (VYVANSE) 40 MG capsule    Sig: Take 1 capsule (40 mg total) by mouth daily as needed.    Dispense:  90 capsule    Refill:  0     Follow-up: Return in about 6 months (around 10/20/2022).  Sanda Linger, MD

## 2022-04-20 ENCOUNTER — Other Ambulatory Visit (HOSPITAL_COMMUNITY): Payer: Self-pay

## 2022-04-20 MED ORDER — LISDEXAMFETAMINE DIMESYLATE 40 MG PO CAPS
40.0000 mg | ORAL_CAPSULE | Freq: Every day | ORAL | 0 refills | Status: DC | PRN
  Filled 2022-04-20 – 2022-08-28 (×2): qty 90, 90d supply, fill #0

## 2022-04-26 DIAGNOSIS — Z1329 Encounter for screening for other suspected endocrine disorder: Secondary | ICD-10-CM | POA: Diagnosis not present

## 2022-04-26 DIAGNOSIS — Z125 Encounter for screening for malignant neoplasm of prostate: Secondary | ICD-10-CM | POA: Diagnosis not present

## 2022-04-26 DIAGNOSIS — E291 Testicular hypofunction: Secondary | ICD-10-CM | POA: Diagnosis not present

## 2022-06-07 DIAGNOSIS — M9902 Segmental and somatic dysfunction of thoracic region: Secondary | ICD-10-CM | POA: Diagnosis not present

## 2022-06-07 DIAGNOSIS — M7522 Bicipital tendinitis, left shoulder: Secondary | ICD-10-CM | POA: Diagnosis not present

## 2022-06-07 DIAGNOSIS — M9901 Segmental and somatic dysfunction of cervical region: Secondary | ICD-10-CM | POA: Diagnosis not present

## 2022-06-21 DIAGNOSIS — M9901 Segmental and somatic dysfunction of cervical region: Secondary | ICD-10-CM | POA: Diagnosis not present

## 2022-06-21 DIAGNOSIS — M9902 Segmental and somatic dysfunction of thoracic region: Secondary | ICD-10-CM | POA: Diagnosis not present

## 2022-06-21 DIAGNOSIS — M7522 Bicipital tendinitis, left shoulder: Secondary | ICD-10-CM | POA: Diagnosis not present

## 2022-08-23 DIAGNOSIS — H9313 Tinnitus, bilateral: Secondary | ICD-10-CM | POA: Diagnosis not present

## 2022-08-23 DIAGNOSIS — H903 Sensorineural hearing loss, bilateral: Secondary | ICD-10-CM | POA: Diagnosis not present

## 2022-08-24 DIAGNOSIS — H903 Sensorineural hearing loss, bilateral: Secondary | ICD-10-CM | POA: Insufficient documentation

## 2022-08-24 DIAGNOSIS — H9313 Tinnitus, bilateral: Secondary | ICD-10-CM | POA: Insufficient documentation

## 2022-08-28 ENCOUNTER — Other Ambulatory Visit (HOSPITAL_COMMUNITY): Payer: Self-pay

## 2022-08-29 NOTE — Progress Notes (Signed)
Name: Antonio James  MRN/ DOB: [id number], 02-16-1986    Age/ Sex: 36 y.o., male     PCP: Janith Lima, MD   Reason for Endocrinology Evaluation: Hyperthyroidism     Initial Endocrinology Clinic Visit: 07/27/2020    PATIENT IDENTIFIER: Antonio James is a 36 y.o., male with a past medical history of ADHD and hyperthyroidism. He has followed with [facility] since 07/27/2020 for consultative assistance with management of his Hyperthyroidism.   HISTORICAL SUMMARY: The patient was first diagnosed with hyperthyroidism in 07/2020 TSH at 0.01 uIU/ML with elevated total T4 at 11.5 mcg/dL.   TRAb elevated at 24.63 IU/L   Methimazole started 07/2020    No FH of thyroid disease  SUBJECTIVE:    Today (08/30/2022):  Antonio James is here for a follow up on hyperthyroidism secondary to Graves' Disease.   Weight has been stable  Denies constipation or diarrhea  Denies local neck symptoms  Denies eye symptoms  Denies tremors or palpitations    Does have imperfect adherence to methimazole     Methimazole 5 mg, half a tablet  daily    HISTORY:  Past Medical History:  Past Medical History:  Diagnosis Date   Adult ADHD    Depression    counseling throughout his life, no meds   GERD (gastroesophageal reflux disease)    Left hip pain    Intermittent, has known torn labrum in left hip   Panic attacks    Situational anxiety    Past Surgical History:  Past Surgical History:  Procedure Laterality Date   WISDOM TOOTH EXTRACTION     No complications   Social History:  reports that he has quit smoking. His smoking use included e-cigarettes. He has quit using smokeless tobacco. He reports current alcohol use. He reports that he does not use drugs. Family History:  Family History  Problem Relation Age of Onset   Cancer Mother        breast   Cancer Maternal Grandmother    Heart disease Maternal Grandfather    Stroke Maternal Grandfather    Diabetes  Paternal Grandmother    Heart attack Paternal Grandfather    Other Paternal Grandfather        hypogonadism     HOME MEDICATIONS: Allergies as of 08/30/2022       Reactions   Sulfa Antibiotics Hives, Rash        Medication List        Accurate as of August 30, 2022  7:42 AM. If you have any questions, ask your nurse or doctor.          cholecalciferol 1000 units tablet Commonly known as: VITAMIN D Take 1,000 Units by mouth daily.   Clomid 50 MG tablet Generic drug: clomiPHENE Take 1 tablet (50 mg total) by mouth daily.   finasteride 1 MG tablet Commonly known as: PROPECIA Take 1 mg by mouth daily.   FISH OIL PO Take 15 mLs by mouth daily.   lisdexamfetamine 40 MG capsule Commonly known as: Vyvanse Take 1 capsule (40 mg total) by mouth daily as needed.   methimazole 5 MG tablet Commonly known as: TAPAZOLE Take 1 tablet (5 mg total) by mouth 2 (two) times daily. What changed:  how much to take when to take this   multivitamin Tabs tablet Take 1 tablet by mouth daily.          OBJECTIVE:   PHYSICAL EXAM: VS: BP 126/80 (BP Location: Left  Arm, Patient Position: Sitting, Cuff Size: Large)   Pulse 70   Ht 6\' 2"  (1.88 m)   Wt 223 lb (101.2 kg)   SpO2 97%   BMI 28.63 kg/m    EXAM: General: Pt appears well and is in NAD  Eyes: External eye exam normal without stare, lid lag or exophthalmos.  EOM intact.    Neck: General: Supple without adenopathy. Thyroid: Thyroid size normal.  No goiter or nodules appreciated. No thyroid bruit.  Lungs: Clear with good BS bilat with no rales, rhonchi, or wheezes  Heart: Auscultation: RRR.  Abdomen: Normoactive bowel sounds, soft, nontender, without masses or organomegaly palpable  Extremities:  BL LE: No pretibial edema normal ROM and strength.  Mental Status: Judgment, insight: Intact Orientation: Oriented to time, place, and person Mood and affect: No depression, anxiety, or agitation     DATA  REVIEWED:   Latest Reference Range & Units 08/30/22 08:07  Sodium 135 - 145 mEq/L 139  Potassium 3.5 - 5.1 mEq/L 4.0  Chloride 96 - 112 mEq/L 104  CO2 19 - 32 mEq/L 28  Glucose 70 - 99 mg/dL 97  BUN 6 - 23 mg/dL 23  Creatinine 09/01/22 - 4.62 mg/dL 7.03  Calcium 8.4 - 5.00 mg/dL 9.3  Alkaline Phosphatase 39 - 117 U/L 49  Albumin 3.5 - 5.2 g/dL 4.3  AST 0 - 37 U/L 22  ALT 0 - 53 U/L 26  Total Protein 6.0 - 8.3 g/dL 6.9  Total Bilirubin 0.2 - 1.2 mg/dL 0.4  GFR 93.8 mL/min 84.11    Latest Reference Range & Units 08/30/22 08:07  WBC 4.0 - 10.5 K/uL 3.5 (L)  RBC 4.22 - 5.81 Mil/uL 5.65  Hemoglobin 13.0 - 17.0 g/dL 09/01/22  HCT 93.7 - 16.9 % 49.9  MCV 78.0 - 100.0 fl 88.3  MCHC 30.0 - 36.0 g/dL 67.8  RDW 93.8 - 10.1 % 14.1  Platelets 150.0 - 400.0 K/uL 171.0  Neutrophils 43.0 - 77.0 % 50.2  Lymphocytes 12.0 - 46.0 % 37.0  Monocytes Relative 3.0 - 12.0 % 11.1  Eosinophil 0.0 - 5.0 % 1.3  Basophil 0.0 - 3.0 % 0.4  NEUT# 1.4 - 7.7 K/uL 1.8  Lymphocyte # 0.7 - 4.0 K/uL 1.3  Monocyte # 0.1 - 1.0 K/uL 0.4  Eosinophils Absolute 0.0 - 0.7 K/uL 0.0  Basophils Absolute 0.0 - 0.1 K/uL 0.0     Latest Reference Range & Units 08/30/22 08:07  TSH 0.35 - 5.50 uIU/mL 2.50  Triiodothyronine (T3) 76 - 181 ng/dL 95  09/01/22) W2,HENI(DPOEUM - 1.60 ng/dL 3.53     ASSESSMENT / PLAN / RECOMMENDATIONS:   Hyperthyroidism secondary to Graves' Disease  - Pt is clinically euthyroid  - No local neck symptoms - We briefly discussed alternative treatment options to include RAI ablation and total thyroidectomy, I did explain to the pt with these treatment option , he will require lifelong LT-4 replacement   - TFT's remain normal   Medications  Methimazole 5 mg, half tabs daily      2. Graves' Disease:  - No extra thyroidal manifestations of Graves' disease - Pt advised to have regular eye exams     F/U in 6 months      Signed electronically by: 6.14, MD  [facility]  Endocrinology  [facility] [address] [place] [place], [place] Phone: [phone] FAX: [phone]      CC: [phone], MD [address] [place]  Phone: [phone]  Fax: [phone]   Return to Endocrinology clinic as below: No future appointments.

## 2022-08-30 ENCOUNTER — Ambulatory Visit: Payer: 59 | Admitting: Internal Medicine

## 2022-08-30 ENCOUNTER — Encounter: Payer: Self-pay | Admitting: Internal Medicine

## 2022-08-30 VITALS — BP 126/80 | HR 70 | Ht 74.0 in | Wt 223.0 lb

## 2022-08-30 DIAGNOSIS — E0590 Thyrotoxicosis, unspecified without thyrotoxic crisis or storm: Secondary | ICD-10-CM | POA: Diagnosis not present

## 2022-08-30 DIAGNOSIS — E0500 Thyrotoxicosis with diffuse goiter without thyrotoxic crisis or storm: Secondary | ICD-10-CM | POA: Diagnosis not present

## 2022-08-30 LAB — CBC WITH DIFFERENTIAL/PLATELET
Basophils Absolute: 0 10*3/uL (ref 0.0–0.1)
Basophils Relative: 0.4 % (ref 0.0–3.0)
Eosinophils Absolute: 0 10*3/uL (ref 0.0–0.7)
Eosinophils Relative: 1.3 % (ref 0.0–5.0)
HCT: 49.9 % (ref 39.0–52.0)
Hemoglobin: 16.7 g/dL (ref 13.0–17.0)
Lymphocytes Relative: 37 % (ref 12.0–46.0)
Lymphs Abs: 1.3 10*3/uL (ref 0.7–4.0)
MCHC: 33.5 g/dL (ref 30.0–36.0)
MCV: 88.3 fl (ref 78.0–100.0)
Monocytes Absolute: 0.4 10*3/uL (ref 0.1–1.0)
Monocytes Relative: 11.1 % (ref 3.0–12.0)
Neutro Abs: 1.8 10*3/uL (ref 1.4–7.7)
Neutrophils Relative %: 50.2 % (ref 43.0–77.0)
Platelets: 171 10*3/uL (ref 150.0–400.0)
RBC: 5.65 Mil/uL (ref 4.22–5.81)
RDW: 14.1 % (ref 11.5–15.5)
WBC: 3.5 10*3/uL — ABNORMAL LOW (ref 4.0–10.5)

## 2022-08-30 LAB — COMPREHENSIVE METABOLIC PANEL
ALT: 26 U/L (ref 0–53)
AST: 22 U/L (ref 0–37)
Albumin: 4.3 g/dL (ref 3.5–5.2)
Alkaline Phosphatase: 49 U/L (ref 39–117)
BUN: 23 mg/dL (ref 6–23)
CO2: 28 mEq/L (ref 19–32)
Calcium: 9.3 mg/dL (ref 8.4–10.5)
Chloride: 104 mEq/L (ref 96–112)
Creatinine, Ser: 1.12 mg/dL (ref 0.40–1.50)
GFR: 84.11 mL/min (ref >60.00)
Glucose, Bld: 97 mg/dL (ref 70–99)
Potassium: 4 mEq/L (ref 3.5–5.1)
Sodium: 139 mEq/L (ref 135–145)
Total Bilirubin: 0.4 mg/dL (ref 0.2–1.2)
Total Protein: 6.9 g/dL (ref 6.0–8.3)

## 2022-08-31 ENCOUNTER — Other Ambulatory Visit (HOSPITAL_COMMUNITY): Payer: Self-pay

## 2022-08-31 LAB — T3: T3, Total: 95 ng/dL (ref 76–181)

## 2022-08-31 LAB — T4, FREE: Free T4: 0.95 ng/dL (ref 0.60–1.60)

## 2022-08-31 LAB — TSH: TSH: 2.5 u[IU]/mL (ref 0.35–5.50)

## 2022-08-31 MED ORDER — METHIMAZOLE 5 MG PO TABS
2.5000 mg | ORAL_TABLET | Freq: Every day | ORAL | 2 refills | Status: DC
  Filled 2022-08-31: qty 45, 90d supply, fill #0
  Filled 2022-12-16: qty 15, 30d supply, fill #0

## 2022-09-16 ENCOUNTER — Other Ambulatory Visit (HOSPITAL_COMMUNITY): Payer: Self-pay

## 2022-12-16 ENCOUNTER — Other Ambulatory Visit: Payer: Self-pay | Admitting: Internal Medicine

## 2022-12-16 DIAGNOSIS — F909 Attention-deficit hyperactivity disorder, unspecified type: Secondary | ICD-10-CM

## 2022-12-16 MED ORDER — LISDEXAMFETAMINE DIMESYLATE 40 MG PO CAPS
40.0000 mg | ORAL_CAPSULE | Freq: Every day | ORAL | 0 refills | Status: DC | PRN
  Filled 2022-12-16: qty 90, 90d supply, fill #0
  Filled 2023-01-02: qty 30, 30d supply, fill #0
  Filled 2023-02-12 – 2023-02-18 (×3): qty 30, 30d supply, fill #1

## 2022-12-17 ENCOUNTER — Other Ambulatory Visit (HOSPITAL_COMMUNITY): Payer: Self-pay

## 2022-12-18 ENCOUNTER — Telehealth: Payer: 59 | Admitting: Nurse Practitioner

## 2022-12-18 ENCOUNTER — Other Ambulatory Visit: Payer: Self-pay

## 2022-12-18 ENCOUNTER — Other Ambulatory Visit (HOSPITAL_COMMUNITY): Payer: Self-pay

## 2022-12-18 DIAGNOSIS — H00014 Hordeolum externum left upper eyelid: Secondary | ICD-10-CM

## 2022-12-18 MED ORDER — BACITRACIN-POLYMYXIN B 500-10000 UNIT/GM OP OINT
1.0000 | TOPICAL_OINTMENT | Freq: Four times a day (QID) | OPHTHALMIC | 0 refills | Status: DC
  Filled 2022-12-18: qty 3.5, 3d supply, fill #0

## 2022-12-18 NOTE — Progress Notes (Signed)
  E-Visit for Stye   We are sorry that you are not feeling well. Here is how we plan to help!  Based on what you have shared with me it looks like you have a stye.  A stye is an inflammation of the eyelid.  It is often a red, painful lump near the edge of the eyelid that may look like a boil or a pimple.  A stye develops when an infection occurs at the base of an eyelash.   We have made appropriate suggestions for you based upon your presentation: We recommend using a warm compress prior, then applying this ophthalmic antibiotic ointment along the eyelashes and around the infected area. This ointment is safe for your eye.   Meds ordered this encounter  Medications   bacitracin-polymyxin b (POLYSPORIN) ophthalmic ointment    Sig: Place 1 Application into the left eye 4 (four) times daily. Use warm compress prior to use    Dispense:  3.5 g    Refill:  0     HOME CARE:  Wash your hands often! Let the stye open on its own. Don't squeeze or open it. Don't rub your eyes. This can irritate your eyes and let in bacteria.  If you need to touch your eyes, wash your hands first. Don't wear eye makeup or contact lenses until the area has healed.  GET HELP RIGHT AWAY IF:  Your symptoms do not improve. You develop blurred or loss of vision. Your symptoms worsen (increased discharge, pain or redness).   Thank you for choosing an e-visit.  Your e-visit answers were reviewed by a board certified advanced clinical practitioner to complete your personal care plan. Depending upon the condition, your plan could have included both over the counter or prescription medications.  Please review your pharmacy choice. Make sure the pharmacy is open so you can pick up prescription now. If there is a problem, you may contact your provider through [employer] and have the prescription routed to another pharmacy.  Your safety is important to [place]. If you have drug allergies check your prescription carefully.    For the next 24 hours you can use MyChart to ask questions about today's visit, request a non-urgent call back, or ask for a work or school excuse. You will get an email in the next two days asking about your experience. I hope that your e-visit has been valuable and will speed your recovery.   Meds ordered this encounter  Medications   bacitracin-polymyxin b (POLYSPORIN) ophthalmic ointment    Sig: Place 1 Application into the left eye 4 (four) times daily. Use warm compress prior to use    Dispense:  3.5 g    Refill:  0    I spent approximately 5 minutes reviewing the patient's history, current symptoms and coordinating their care today.

## 2022-12-19 MED ORDER — AMOXICILLIN-POT CLAVULANATE 875-125 MG PO TABS
1.0000 | ORAL_TABLET | Freq: Two times a day (BID) | ORAL | 0 refills | Status: AC
  Filled 2022-12-19 – 2022-12-20 (×2): qty 14, 7d supply, fill #0

## 2022-12-19 NOTE — Addendum Note (Signed)
Addended by: Viviano Simas E on: 12/19/2022 07:12 PM   Modules accepted: Orders

## 2022-12-20 ENCOUNTER — Other Ambulatory Visit (HOSPITAL_COMMUNITY): Payer: Self-pay

## 2022-12-21 ENCOUNTER — Telehealth: Payer: Self-pay

## 2022-12-21 ENCOUNTER — Other Ambulatory Visit (HOSPITAL_COMMUNITY): Payer: Self-pay

## 2022-12-21 NOTE — Telephone Encounter (Signed)
PA has been sent in for the pts lisdexamfetamine (VYVANSE) 40 MG capsule  under his new insurance.   (Key: [id number])

## 2023-01-02 ENCOUNTER — Other Ambulatory Visit (HOSPITAL_COMMUNITY): Payer: Self-pay

## 2023-01-02 ENCOUNTER — Other Ambulatory Visit: Payer: Self-pay

## 2023-01-03 ENCOUNTER — Other Ambulatory Visit: Payer: Self-pay

## 2023-01-03 ENCOUNTER — Other Ambulatory Visit (HOSPITAL_COMMUNITY): Payer: Self-pay

## 2023-01-04 ENCOUNTER — Other Ambulatory Visit (HOSPITAL_COMMUNITY): Payer: Self-pay

## 2023-01-09 ENCOUNTER — Other Ambulatory Visit (HOSPITAL_COMMUNITY): Payer: Self-pay

## 2023-01-15 ENCOUNTER — Encounter: Payer: Self-pay | Admitting: Internal Medicine

## 2023-01-16 ENCOUNTER — Encounter: Payer: Self-pay | Admitting: Nurse Practitioner

## 2023-01-16 ENCOUNTER — Telehealth: Payer: 59 | Admitting: Nurse Practitioner

## 2023-01-16 NOTE — Progress Notes (Signed)
No show for Video Visit Message sent via Mychart for reschedule

## 2023-02-14 ENCOUNTER — Other Ambulatory Visit (HOSPITAL_COMMUNITY): Payer: Self-pay

## 2023-02-15 DIAGNOSIS — E291 Testicular hypofunction: Secondary | ICD-10-CM | POA: Diagnosis not present

## 2023-02-15 DIAGNOSIS — Z79890 Hormone replacement therapy (postmenopausal): Secondary | ICD-10-CM | POA: Diagnosis not present

## 2023-02-18 ENCOUNTER — Other Ambulatory Visit (HOSPITAL_COMMUNITY): Payer: Self-pay

## 2023-02-18 ENCOUNTER — Other Ambulatory Visit: Payer: Self-pay | Admitting: Internal Medicine

## 2023-02-18 ENCOUNTER — Other Ambulatory Visit (HOSPITAL_BASED_OUTPATIENT_CLINIC_OR_DEPARTMENT_OTHER): Payer: Self-pay

## 2023-02-18 DIAGNOSIS — F909 Attention-deficit hyperactivity disorder, unspecified type: Secondary | ICD-10-CM

## 2023-02-19 ENCOUNTER — Other Ambulatory Visit (HOSPITAL_COMMUNITY): Payer: Self-pay

## 2023-02-21 ENCOUNTER — Other Ambulatory Visit: Payer: Self-pay

## 2023-02-21 ENCOUNTER — Other Ambulatory Visit: Payer: Self-pay | Admitting: Internal Medicine

## 2023-02-21 ENCOUNTER — Telehealth: Payer: Self-pay

## 2023-02-21 ENCOUNTER — Other Ambulatory Visit (HOSPITAL_COMMUNITY): Payer: Self-pay

## 2023-02-21 DIAGNOSIS — F909 Attention-deficit hyperactivity disorder, unspecified type: Secondary | ICD-10-CM

## 2023-02-21 MED ORDER — LISDEXAMFETAMINE DIMESYLATE 40 MG PO CAPS
40.0000 mg | ORAL_CAPSULE | Freq: Every day | ORAL | 0 refills | Status: DC | PRN
Start: 2023-02-21 — End: 2023-04-04
  Filled 2023-02-21: qty 30, 30d supply, fill #0

## 2023-02-21 NOTE — Telephone Encounter (Signed)
Pt has called asking for a refill of his   lisdexamfetamine (VYVANSE) 40 MG capsule  . Pt has made an apptmnt for 03/14/2023 at 1pm. Pt asked can a short supply be sent in until his july apptmnt.

## 2023-02-26 ENCOUNTER — Telehealth: Payer: Self-pay | Admitting: Internal Medicine

## 2023-02-26 ENCOUNTER — Other Ambulatory Visit: Payer: Self-pay | Admitting: Internal Medicine

## 2023-02-26 ENCOUNTER — Other Ambulatory Visit (HOSPITAL_COMMUNITY): Payer: Self-pay

## 2023-02-26 DIAGNOSIS — F909 Attention-deficit hyperactivity disorder, unspecified type: Secondary | ICD-10-CM

## 2023-02-26 MED ORDER — LISDEXAMFETAMINE DIMESYLATE 30 MG PO CAPS
30.0000 mg | ORAL_CAPSULE | Freq: Every day | ORAL | 0 refills | Status: DC
Start: 2023-02-26 — End: 2023-04-05
  Filled 2023-02-26: qty 30, 30d supply, fill #0

## 2023-02-26 NOTE — Telephone Encounter (Signed)
Patient called and said his pharmacy has been out of lisdexamfetamine (VYVANSE) 40 MG capsule for a while. He spoke with them and they said they have 30 mg. Patient would like to know if he can switch to 30 mg dosage and if he would need a new approval for it. Best callback is [phone].

## 2023-02-27 ENCOUNTER — Other Ambulatory Visit (HOSPITAL_COMMUNITY): Payer: Self-pay

## 2023-02-27 DIAGNOSIS — E291 Testicular hypofunction: Secondary | ICD-10-CM | POA: Diagnosis not present

## 2023-02-27 DIAGNOSIS — N529 Male erectile dysfunction, unspecified: Secondary | ICD-10-CM | POA: Diagnosis not present

## 2023-02-27 DIAGNOSIS — Z72820 Sleep deprivation: Secondary | ICD-10-CM | POA: Diagnosis not present

## 2023-02-27 DIAGNOSIS — D751 Secondary polycythemia: Secondary | ICD-10-CM | POA: Diagnosis not present

## 2023-02-27 DIAGNOSIS — F902 Attention-deficit hyperactivity disorder, combined type: Secondary | ICD-10-CM | POA: Diagnosis not present

## 2023-02-27 DIAGNOSIS — Z6828 Body mass index (BMI) 28.0-28.9, adult: Secondary | ICD-10-CM | POA: Diagnosis not present

## 2023-03-01 ENCOUNTER — Other Ambulatory Visit (HOSPITAL_COMMUNITY): Payer: Self-pay

## 2023-03-01 ENCOUNTER — Ambulatory Visit: Payer: Commercial Managed Care - PPO | Admitting: Internal Medicine

## 2023-03-01 NOTE — Progress Notes (Deleted)
Name: Antonio James  MRN/ DOB: [id number], [birth year]    Age/ Sex: 37 y.o., male     PCP: Etta Grandchild, MD   Reason for Endocrinology Evaluation: Hyperthyroidism     Initial Endocrinology Clinic Visit: 07/27/2020    PATIENT IDENTIFIER: Antonio James is a 37 y.o., male with a past medical history of ADHD and hyperthyroidism. He has followed with [facility] since 07/27/2020 for consultative assistance with management of his Hyperthyroidism.   HISTORICAL SUMMARY: The patient was first diagnosed with hyperthyroidism in 07/2020 TSH at 0.01 uIU/ML with elevated total T4 at 11.5 mcg/dL.   TRAb elevated at 24.63 IU/L   Methimazole started 07/2020    No FH of thyroid disease  SUBJECTIVE:    Today (03/01/2023):  Antonio James is here for a follow up on hyperthyroidism secondary to Graves' Disease.   Weight has been stable  Denies constipation or diarrhea  Denies local neck symptoms  Denies eye symptoms  Denies tremors or palpitations    Does have imperfect adherence to methimazole     Methimazole 5 mg, half a tablet  daily    HISTORY:  Past Medical History:  Past Medical History:  Diagnosis Date   Adult ADHD    Depression    counseling throughout his life, no meds   GERD (gastroesophageal reflux disease)    Left hip pain    Intermittent, has known torn labrum in left hip   Panic attacks    Situational anxiety    Past Surgical History:  Past Surgical History:  Procedure Laterality Date   WISDOM TOOTH EXTRACTION     No complications   Social History:  reports that he has quit smoking. His smoking use included e-cigarettes. He has quit using smokeless tobacco. He reports current alcohol use. He reports that he does not use drugs. Family History:  Family History  Problem Relation Age of Onset   Cancer Mother        breast   Cancer Maternal Grandmother    Heart disease Maternal Grandfather    Stroke Maternal Grandfather    Diabetes  Paternal Grandmother    Heart attack Paternal Grandfather    Other Paternal Grandfather        hypogonadism     HOME MEDICATIONS: Allergies as of 03/01/2023       Reactions   Sulfa Antibiotics Hives, Rash        Medication List        Accurate as of March 01, 2023  6:42 AM. If you have any questions, ask your nurse or doctor.          cholecalciferol 1000 units tablet Commonly known as: VITAMIN D Take 1,000 Units by mouth daily.   Clomid 50 MG tablet Generic drug: clomiPHENE Take 1 tablet (50 mg total) by mouth daily.   finasteride 1 MG tablet Commonly known as: PROPECIA Take 1 mg by mouth daily.   FISH OIL PO Take 15 mLs by mouth daily.   lisdexamfetamine 40 MG capsule Commonly known as: Vyvanse Take 1 capsule (40 mg total) by mouth daily as needed.   lisdexamfetamine 30 MG capsule Commonly known as: Vyvanse Take 1 capsule (30 mg total) by mouth daily.   methimazole 5 MG tablet Commonly known as: TAPAZOLE Take 1/2 tablet (2.5 mg total) by mouth daily.   multivitamin Tabs tablet Take 1 tablet by mouth daily.          OBJECTIVE:   PHYSICAL EXAM: VS:  There were no vitals taken for this visit.   EXAM: General: Pt appears well and is in NAD  Eyes: External eye exam normal without stare, lid lag or exophthalmos.  EOM intact.    Neck: General: Supple without adenopathy. Thyroid: Thyroid size normal.  No goiter or nodules appreciated. No thyroid bruit.  Lungs: Clear with good BS bilat with no rales, rhonchi, or wheezes  Heart: Auscultation: RRR.  Abdomen: Normoactive bowel sounds, soft, nontender, without masses or organomegaly palpable  Extremities:  BL LE: No pretibial edema normal ROM and strength.  Mental Status: Judgment, insight: Intact Orientation: Oriented to time, place, and person Mood and affect: No depression, anxiety, or agitation     DATA REVIEWED:   Latest Reference Range & Units 08/30/22 08:07  Sodium 135 - 145 mEq/L 139   Potassium 3.5 - 5.1 mEq/L 4.0  Chloride 96 - 112 mEq/L 104  CO2 19 - 32 mEq/L 28  Glucose 70 - 99 mg/dL 97  BUN 6 - 23 mg/dL 23  Creatinine 8.11 - 9.14 mg/dL 7.82  Calcium 8.4 - 95.6 mg/dL 9.3  Alkaline Phosphatase 39 - 117 U/L 49  Albumin 3.5 - 5.2 g/dL 4.3  AST 0 - 37 U/L 22  ALT 0 - 53 U/L 26  Total Protein 6.0 - 8.3 g/dL 6.9  Total Bilirubin 0.2 - 1.2 mg/dL 0.4  GFR >21.30 mL/min 84.11    Latest Reference Range & Units 08/30/22 08:07  WBC 4.0 - 10.5 K/uL 3.5 (L)  RBC 4.22 - 5.81 Mil/uL 5.65  Hemoglobin 13.0 - 17.0 g/dL 86.5  HCT 78.4 - 69.6 % 49.9  MCV 78.0 - 100.0 fl 88.3  MCHC 30.0 - 36.0 g/dL 29.5  RDW 28.4 - 13.2 % 14.1  Platelets 150.0 - 400.0 K/uL 171.0  Neutrophils 43.0 - 77.0 % 50.2  Lymphocytes 12.0 - 46.0 % 37.0  Monocytes Relative 3.0 - 12.0 % 11.1  Eosinophil 0.0 - 5.0 % 1.3  Basophil 0.0 - 3.0 % 0.4  NEUT# 1.4 - 7.7 K/uL 1.8  Lymphocyte # 0.7 - 4.0 K/uL 1.3  Monocyte # 0.1 - 1.0 K/uL 0.4  Eosinophils Absolute 0.0 - 0.7 K/uL 0.0  Basophils Absolute 0.0 - 0.1 K/uL 0.0     Latest Reference Range & Units 08/30/22 08:07  TSH 0.35 - 5.50 uIU/mL 2.50  Triiodothyronine (T3) 76 - 181 ng/dL 95  G4,WNUU(VOZDGU) 4.40 - 1.60 ng/dL 3.47     ASSESSMENT / PLAN / RECOMMENDATIONS:   Hyperthyroidism secondary to Graves' Disease  - Pt is clinically euthyroid  - No local neck symptoms - We briefly discussed alternative treatment options to include RAI ablation and total thyroidectomy, I did explain to the pt with these treatment option , he will require lifelong LT-4 replacement   - TFT's remain normal   Medications  Methimazole 5 mg, half tabs daily      2. Graves' Disease:  - No extra thyroidal manifestations of Graves' disease - Pt advised to have regular eye exams     F/U in 6 months      Signed electronically by: Lyndle Herrlich, MD  [facility] Endocrinology  [facility] [address] [place] [place], [place]  [address] Phone: [phone] FAX: [phone]      CC: Etta Grandchild, MD [address] [place] [address] Phone: [phone]  Fax: [phone]   Return to Endocrinology clinic as below: Future Appointments  Date Time Provider Department Center  03/01/2023  7:30 AM Akeira Lahm,  Konrad Dolores, MD [facility] None  03/14/2023  1:00 PM Etta Grandchild, MD [facility] None

## 2023-03-04 ENCOUNTER — Other Ambulatory Visit: Payer: Self-pay

## 2023-03-04 ENCOUNTER — Other Ambulatory Visit (HOSPITAL_COMMUNITY): Payer: Self-pay

## 2023-03-14 ENCOUNTER — Ambulatory Visit: Payer: 59 | Admitting: Internal Medicine

## 2023-04-02 ENCOUNTER — Other Ambulatory Visit (HOSPITAL_COMMUNITY): Payer: Self-pay

## 2023-04-04 ENCOUNTER — Encounter: Payer: Self-pay | Admitting: Internal Medicine

## 2023-04-04 ENCOUNTER — Ambulatory Visit (INDEPENDENT_AMBULATORY_CARE_PROVIDER_SITE_OTHER): Payer: 59 | Admitting: Internal Medicine

## 2023-04-04 VITALS — BP 124/76 | HR 75 | Temp 97.8°F | Resp 16 | Ht 74.0 in | Wt 224.0 lb

## 2023-04-04 DIAGNOSIS — D72820 Lymphocytosis (symptomatic): Secondary | ICD-10-CM | POA: Diagnosis not present

## 2023-04-04 DIAGNOSIS — E785 Hyperlipidemia, unspecified: Secondary | ICD-10-CM

## 2023-04-04 DIAGNOSIS — E0500 Thyrotoxicosis with diffuse goiter without thyrotoxic crisis or storm: Secondary | ICD-10-CM

## 2023-04-04 DIAGNOSIS — D696 Thrombocytopenia, unspecified: Secondary | ICD-10-CM | POA: Insufficient documentation

## 2023-04-04 DIAGNOSIS — F909 Attention-deficit hyperactivity disorder, unspecified type: Secondary | ICD-10-CM | POA: Diagnosis not present

## 2023-04-04 DIAGNOSIS — R7989 Other specified abnormal findings of blood chemistry: Secondary | ICD-10-CM

## 2023-04-04 DIAGNOSIS — Z125 Encounter for screening for malignant neoplasm of prostate: Secondary | ICD-10-CM | POA: Diagnosis not present

## 2023-04-04 DIAGNOSIS — E291 Testicular hypofunction: Secondary | ICD-10-CM | POA: Diagnosis not present

## 2023-04-04 LAB — CBC WITH DIFFERENTIAL/PLATELET
Basophils Absolute: 0 10*3/uL (ref 0.0–0.1)
Basophils Relative: 0.5 % (ref 0.0–3.0)
Eosinophils Absolute: 0 10*3/uL (ref 0.0–0.7)
Eosinophils Relative: 0.7 % (ref 0.0–5.0)
HCT: 48.2 % (ref 39.0–52.0)
Hemoglobin: 16.1 g/dL (ref 13.0–17.0)
Lymphocytes Relative: 60.2 % — ABNORMAL HIGH (ref 12.0–46.0)
Lymphs Abs: 2.9 10*3/uL (ref 0.7–4.0)
MCHC: 33.4 g/dL (ref 30.0–36.0)
MCV: 86.3 fl (ref 78.0–100.0)
Monocytes Absolute: 0.5 10*3/uL (ref 0.1–1.0)
Monocytes Relative: 9.9 % (ref 3.0–12.0)
Neutro Abs: 1.4 10*3/uL (ref 1.4–7.7)
Neutrophils Relative %: 28.7 % — ABNORMAL LOW (ref 43.0–77.0)
Platelets: 138 10*3/uL — ABNORMAL LOW (ref 150.0–400.0)
RBC: 5.58 Mil/uL (ref 4.22–5.81)
RDW: 14.1 % (ref 11.5–15.5)
WBC: 4.8 10*3/uL (ref 4.0–10.5)

## 2023-04-04 LAB — LIPID PANEL
Cholesterol: 177 mg/dL (ref 0–200)
HDL: 32.5 mg/dL — ABNORMAL LOW (ref >39.00)
LDL Cholesterol: 118 mg/dL — ABNORMAL HIGH (ref 0–99)
NonHDL: 144.51
Total CHOL/HDL Ratio: 5
Triglycerides: 134 mg/dL (ref 0.0–149.0)
VLDL: 26.8 mg/dL (ref 0.0–40.0)

## 2023-04-04 LAB — BASIC METABOLIC PANEL
BUN: 16 mg/dL (ref 6–23)
CO2: 30 mEq/L (ref 19–32)
Calcium: 8.9 mg/dL (ref 8.4–10.5)
Chloride: 100 mEq/L (ref 96–112)
Creatinine, Ser: 1.13 mg/dL (ref 0.40–1.50)
GFR: 82.87 mL/min (ref >60.00)
Glucose, Bld: 81 mg/dL (ref 70–99)
Potassium: 4.3 mEq/L (ref 3.5–5.1)
Sodium: 137 mEq/L (ref 135–145)

## 2023-04-04 LAB — HEPATIC FUNCTION PANEL
ALT: 119 U/L — ABNORMAL HIGH (ref 0–53)
AST: 75 U/L — ABNORMAL HIGH (ref 0–37)
Albumin: 4.1 g/dL (ref 3.5–5.2)
Alkaline Phosphatase: 67 U/L (ref 39–117)
Bilirubin, Direct: 0.1 mg/dL (ref 0.0–0.3)
Total Bilirubin: 0.8 mg/dL (ref 0.2–1.2)
Total Protein: 6.7 g/dL (ref 6.0–8.3)

## 2023-04-04 LAB — PSA: PSA: 0.4 ng/mL (ref 0.10–4.00)

## 2023-04-04 LAB — TSH: TSH: 2.02 u[IU]/mL (ref 0.35–5.50)

## 2023-04-04 NOTE — Patient Instructions (Signed)
Blue Lizard for sensitive skin sunscreen and Eucerin has a sensitive skin sun screen  Attention Deficit Hyperactivity Disorder, Adult Attention deficit hyperactivity disorder (ADHD) is a mental health disorder that starts during childhood. For many people with ADHD, the disorder continues into the adult years. Treatment can help you manage your symptoms. There are three main types of ADHD: Inattentive. With this type, adults have difficulty paying attention. This may affect cognitive abilities. Hyperactive-impulsive. With this type, adults have a lot of energy and have difficulty controlling their behavior. Combination type. Some people may have symptoms of both types. What are the causes? The exact cause of ADHD is not known. Most experts believe a person's genes and environment possibly contribute to ADHD. What increases the risk? The following factors may make you more likely to develop this condition: Having a first-degree relative such as a parent, brother, or sister, with the condition. Being born before 37 weeks of pregnancy (prematurely) or at a low birth weight. Being born to a mother who smoked tobacco or drank alcohol during pregnancy. Having experienced a brain injury. Being exposed to lead or other toxins in the womb or early in life. What are the signs or symptoms? Symptoms of this condition depend on the type of ADHD. Symptoms of the inattentive type include: Difficulty paying attention or following instructions. Often making simple mistakes. Being disorganized. Avoiding tasks that require time and attention. Losing and forgetting things. Symptoms of the hyperactive-impulsive type include: Restlessness. Talking out of turn, interrupting others, or talking too much. Difficulty with: Sitting still. Feeling motivated. Relaxing. Waiting in line or waiting for a turn. People with the combination type have symptoms of both of the other types. In adults, this condition may  lead to certain problems, such as: Keeping jobs. Performing tasks at work. Having stable relationships. Being on time or keeping to a schedule. How is this diagnosed? This condition is diagnosed based on your current symptoms and your history of symptoms. The diagnosis can be made by a health care provider such as a primary care provider or a mental health care specialist. Your health care provider may use a symptom checklist or a behavior rating scale to evaluate your symptoms. Your health care provider may also want to talk with people who have observed your behaviors throughout your life. How is this treated? This condition can be treated with medicines and behavior therapy. Medicines may be the best option to reduce impulsive behaviors and improve attention. Your health care provider may recommend: Stimulant medicines. These are the most common medicines used for adult ADHD. They affect certain chemicals in the brain (neurotransmitters) and improve your ability to control your symptoms. A non-stimulant medicine. These medicines can also improve focus, attention, and impulsive behavior. It may take weeks to months to see the effects of this medicine. Counseling and behavioral management are also important for treating ADHD. Counseling is often used along with medicine. Your health care provider may suggest: Cognitive behavioral therapy (CBT). This type of therapy teaches you to replace negative thoughts and actions with positive thoughts and actions. When used as part of ADHD treatment, this therapy may also include: Coping strategies for organization, time management, impulse control, and stress reduction. Mindfulness and meditation training. Behavioral management. You may work with a coach who is specially trained to help people with ADHD manage and organize activities and function more effectively. Follow these instructions at home: Medicines  Take over-the-counter and prescription  medicines only as told by your health care provider.   Talk with your health care provider about the possible side effects of your medicines and how to manage them. Alcohol use Do not drink alcohol if: Your health care provider tells you not to drink. You are pregnant, may be pregnant, or are planning to become pregnant. If you drink alcohol: Limit how much you use to: 0-1 drink a day for women. 0-2 drinks a day for men. Know how much alcohol is in your drink. In the [place] one drink equals one 12 oz bottle of beer (355 mL), one 5 oz glass of wine (148 mL), or one 1 oz glass of hard liquor (44 mL). Lifestyle  Do not use illegal drugs. Get enough sleep. Eat a healthy diet. Exercise regularly. Exercise can help to reduce stress and anxiety. General instructions Learn as much as you can about adult ADHD, and work closely with your health care providers to find the treatments that work best for you. Follow the same schedule each day. Use reminder devices like notes, calendars, and phone apps to stay on time and organized. Keep all follow-up visits. Your health care provider will need to monitor your condition and adjust your treatment over time. Where to find more information A health care provider may be able to recommend resources that are available online or over the phone. You could start with: Attention Deficit Disorder Association (ADDA): add.org National Institute of Mental Health (NIMH): nimh.nih.gov Contact a health care provider if: Your symptoms continue to cause problems. You have side effects from your medicine, such as: Repeated muscle twitches, coughing, or speech outbursts. Sleep problems. Loss of appetite. Dizziness. Unusually fast heartbeat. Stomach pains. Headaches. You are struggling with anxiety, depression, or substance abuse. Get help right away if: You have a severe reaction to a medicine. This symptom may be an emergency. Get help right away. Call 911. Do  not wait to see if the symptom will go away. Do not drive yourself to the hospital. Take one of these steps if you feel like you may hurt yourself or others, or have thoughts about taking your own life: Go to your nearest emergency room. Call 911. Call the National Suicide Prevention Lifeline at 1-800-273-8255 or 988. This is open 24 hours a day Text the Crisis Text Line at 741741. Summary ADHD is a mental health disorder that starts during childhood and often continues into your adult years. The exact cause of ADHD is not known. Most experts believe genetics and environmental factors contribute to ADHD. There is no cure for ADHD, but treatment with medicine, cognitive behavioral therapy, or behavioral management can help you manage your condition. This information is not intended to replace advice given to you by your health care provider. Make sure you discuss any questions you have with your health care provider. Document Revised: 12/08/2021 Document Reviewed: 12/08/2021 Elsevier Patient Education  2024 Elsevier Inc.  

## 2023-04-04 NOTE — Progress Notes (Signed)
Subjective:  Patient ID: Antonio James, male    DOB: October 23, 1985  Age: 37 y.o. MRN: [id number]  CC: ADHD   HPI Antonio James presents for f/up -----  Discussed the use of AI scribe software for clinical note transcription with the patient, who gave verbal consent to proceed.  History of Present Illness   The patient, diagnosed with Graves' disease, reports a generally active lifestyle despite the demands of work and family. He engages in regular exercise, including running and high-intensity interval training, without experiencing chest pain or shortness of breath. However, he notes that he often feels physically sore due to inadequate stretching and self-care.  Regarding his Graves' disease, the patient has been on methimazole and has been gradually reducing the dosage with the hope of achieving remission. He reports no discomfort or pain associated with his thyroid. The onset of his condition was marked by sudden weight loss and heightened anxiety, which he attributes to prolonged periods of stress.  The patient also reports a persistent issue with tinnitus, describing it as constant and loud. The condition has been severe enough to disrupt his sleep, but he denies any associated hearing loss, dizziness, or lightheadedness. Despite seeking medical help, he has been informed that there is little that can be done to alleviate the ringing.  In addition to his Graves' disease and tinnitus, the patient has been diagnosed with ADD and is currently on a 30mg  dose of Vyvanse. He expresses a desire to eventually taper off the medication, but acknowledges its necessity in managing his symptoms, particularly when dealing with paperwork and multiple tasks at work. He reports occasional sleep disturbances if the medication is taken later in the day.  The patient is due for a follow-up with his endocrinologist and is interested in having his testosterone levels and blood count checked. He has missed  his previous appointment and needs to reschedule. He expresses a willingness to undergo blood work to ensure his overall health status is satisfactory.       Outpatient Medications Prior to Visit  Medication Sig Dispense Refill   cholecalciferol (VITAMIN D) 1000 UNITS tablet Take 1,000 Units by mouth daily.     clomiPHENE (CLOMID) 50 MG tablet Take 1 tablet (50 mg total) by mouth daily. 30 tablet 11   finasteride (PROPECIA) 1 MG tablet Take 1 mg by mouth daily.     methimazole (TAPAZOLE) 5 MG tablet Take 1/2 tablet (2.5 mg total) by mouth daily. 45 tablet 2   multivitamin (ONE-A-DAY MEN'S) TABS tablet Take 1 tablet by mouth daily.     Omega-3 Fatty Acids (FISH OIL PO) Take 15 mLs by mouth daily.     lisdexamfetamine (VYVANSE) 30 MG capsule Take 1 capsule (30 mg total) by mouth daily. 30 capsule 0   lisdexamfetamine (VYVANSE) 40 MG capsule Take 1 capsule (40 mg total) by mouth daily as needed. 30 capsule 0   No facility-administered medications prior to visit.    ROS Review of Systems  Constitutional:  Negative for appetite change, diaphoresis, fatigue and unexpected weight change.  HENT:  Positive for tinnitus. Negative for congestion, ear pain and sinus pressure.   Eyes: Negative.   Respiratory: Negative.  Negative for cough, chest tightness, shortness of breath and wheezing.   Cardiovascular:  Negative for chest pain, palpitations and leg swelling.  Gastrointestinal: Negative.  Negative for abdominal pain, constipation, diarrhea and nausea.  Endocrine: Negative.   Genitourinary: Negative.  Negative for difficulty urinating.  Musculoskeletal: Negative.  Negative  for arthralgias and myalgias.  Skin: Negative.  Negative for color change.  Neurological:  Negative for dizziness, weakness and light-headedness.  Hematological:  Negative for adenopathy. Does not bruise/bleed easily.  Psychiatric/Behavioral:  Positive for decreased concentration. Negative for confusion and self-injury. The  patient is not nervous/anxious.     Objective:  BP 124/76 (BP Location: Left Arm, Patient Position: Sitting, Cuff Size: Large)   Pulse 75   Temp 97.8 F (36.6 C) (Oral)   Resp 16   Ht 6\' 2"  (1.88 m)   Wt 224 lb (101.6 kg)   SpO2 97%   BMI 28.76 kg/m   BP Readings from Last 3 Encounters:  04/05/23 116/70  04/04/23 124/76  08/30/22 126/80    Wt Readings from Last 3 Encounters:  04/05/23 221 lb (100.2 kg)  04/04/23 224 lb (101.6 kg)  08/30/22 223 lb (101.2 kg)    Physical Exam Vitals reviewed.  Constitutional:      Appearance: Normal appearance.  HENT:     Mouth/Throat:     Mouth: Mucous membranes are moist.  Eyes:     General: No scleral icterus.    Conjunctiva/sclera: Conjunctivae normal.  Cardiovascular:     Rate and Rhythm: Normal rate and regular rhythm.     Heart sounds: No murmur heard. Pulmonary:     Effort: Pulmonary effort is normal.     Breath sounds: No stridor. No wheezing, rhonchi or rales.  Abdominal:     General: Abdomen is flat.     Palpations: There is no mass.     Tenderness: There is no abdominal tenderness. There is no guarding.     Hernia: No hernia is present.  Musculoskeletal:        General: Normal range of motion.     Cervical back: Neck supple.     Right lower leg: No edema.     Left lower leg: No edema.  Lymphadenopathy:     Cervical: No cervical adenopathy.  Skin:    General: Skin is warm and dry.  Neurological:     General: No focal deficit present.     Mental Status: He is alert. Mental status is at baseline.  Psychiatric:        Mood and Affect: Mood normal.        Behavior: Behavior normal.     Lab Results  Component Value Date   WBC 4.8 04/04/2023   HGB 16.1 04/04/2023   HCT 48.2 04/04/2023   PLT 138.0 (L) 04/04/2023   GLUCOSE 81 04/04/2023   CHOL 177 04/04/2023   TRIG 134.0 04/04/2023   HDL 32.50 (L) 04/04/2023   LDLCALC 118 (H) 04/04/2023   ALT 143 (H) 04/05/2023   AST 82 (H) 04/05/2023   NA 137 04/04/2023    K 4.3 04/04/2023   CL 100 04/04/2023   CREATININE 1.13 04/04/2023   BUN 16 04/04/2023   CO2 30 04/04/2023   TSH 2.02 04/04/2023   PSA 0.40 04/04/2023   INR 1.1 (H) 04/05/2023    VAS [place] UPPER EXTREMITY VENOUS DUPLEX  Result Date: 02/17/2021 UPPER VENOUS STUDY  Patient Name:  Antonio James [facility]  Date of Exam:   02/16/2021 Medical Rec #: [id number]        Accession #:    [id number] Date of Birth: [birth year]       Patient Gender: M Patient Age:   42Y Exam Location:  [facility] Procedure:      VAS [place] UPPER EXTREMITY VENOUS DUPLEX Referring Phys: [id number] Bobette Mo  [place] --------------------------------------------------------------------------------  Other Indications: Right arm heaviness for the past nine months; "feels like blood pooling". He denies any pain. Risk Factors: None identified. Performing Technologist: Dondra Prader RVT  Examination Guidelines: A complete evaluation includes B-mode imaging, spectral Doppler, color Doppler, and power Doppler as needed of all accessible portions of each vessel. Bilateral testing is considered an integral part of a complete examination. Limited examinations for reoccurring indications may be performed as noted.  Right Findings: +----------+------------+---------+-----------+----------+-------+ RIGHT     CompressiblePhasicitySpontaneousPropertiesSummary +----------+------------+---------+-----------+----------+-------+ IJV           Full       Yes       Yes                      +----------+------------+---------+-----------+----------+-------+ Subclavian               Yes       Yes                      +----------+------------+---------+-----------+----------+-------+ Axillary      Full       Yes       Yes                      +----------+------------+---------+-----------+----------+-------+ Brachial      Full       Yes       Yes                      +----------+------------+---------+-----------+----------+-------+ Radial        Full        Yes       Yes                      +----------+------------+---------+-----------+----------+-------+ Ulnar         Full       Yes       Yes                      +----------+------------+---------+-----------+----------+-------+ Cephalic      Full       Yes       Yes                      +----------+------------+---------+-----------+----------+-------+ Basilic       Full       Yes       Yes                      +----------+------------+---------+-----------+----------+-------+  Left Findings: +----------+------------+---------+-----------+----------+-------+ LEFT      CompressiblePhasicitySpontaneousPropertiesSummary +----------+------------+---------+-----------+----------+-------+ IJV           Full       Yes       Yes                      +----------+------------+---------+-----------+----------+-------+ Subclavian               Yes       Yes                      +----------+------------+---------+-----------+----------+-------+  Summary:  Right: No evidence of deep vein thrombosis in the upper extremity. No evidence of superficial vein thrombosis in the upper extremity. No evidence of thrombosis in the subclavian.  Left: No evidence of thrombosis in the subclavian.  *See table(s) above for measurements and observations.  Diagnosing  physician: Nanetta Batty MD Electronically signed by Nanetta Batty MD on 02/17/2021 at 11:26:46 AM.    Final     Assessment & Plan:   Dyslipidemia, goal LDL below 130- Statin is not indicated. -     Lipoprotein A (LPA); Future -     Lipid panel; Future -     TSH; Future -     CBC with Differential/Platelet; Future -     Basic metabolic panel; Future -     Hepatic function panel; Future  Graves disease- He is euthyroid. -     TSH; Future -     CBC with Differential/Platelet; Future -     Basic metabolic panel; Future -     Hepatic function panel; Future  Hypogonadism male -     Testosterone Total,Free,Bio, Males;  Future -     CBC with Differential/Platelet; Future -     Basic metabolic panel; Future -     Hepatic function panel; Future  Screening for prostate cancer -     PSA; Future  Thrombocytopenia (HCC)- Will repeat in 1 to 2 months. -     Vitamin B12; Future -     Folate; Future  Lymphocytosis  Elevated LFTs- Testing for viral hepatitis is negative.  I recommend that he get vaccinated against hepatitis A and B.  The workup for liver disease was negative. -     Protime-INR; Future -     Mitochondrial/smooth muscle ab pnl; Future -     ANA; Future -     AntiMicrosomal Ab-Liver / Kidney; Future -     Hepatitis B surface antibody,quantitative; Future -     Hepatitis B surface antigen; Future -     Hepatitis A antibody, total; Future -     Hepatitis B core antibody, total; Future -     Hepatitis C antibody; Future  Adult ADHD -     Lisdexamfetamine Dimesylate; Take 1 capsule (30 mg total) by mouth daily.  Dispense: 30 capsule; Refill: 0     Follow-up: Return in about 6 months (around 10/05/2023).  Sanda Linger, MD

## 2023-04-05 ENCOUNTER — Other Ambulatory Visit: Payer: Self-pay | Admitting: Internal Medicine

## 2023-04-05 ENCOUNTER — Ambulatory Visit (INDEPENDENT_AMBULATORY_CARE_PROVIDER_SITE_OTHER): Payer: 59 | Admitting: Internal Medicine

## 2023-04-05 ENCOUNTER — Encounter: Payer: Self-pay | Admitting: Internal Medicine

## 2023-04-05 ENCOUNTER — Other Ambulatory Visit (HOSPITAL_COMMUNITY): Payer: Self-pay

## 2023-04-05 VITALS — BP 116/70 | HR 71 | Temp 98.5°F | Ht 74.0 in | Wt 221.0 lb

## 2023-04-05 DIAGNOSIS — R7989 Other specified abnormal findings of blood chemistry: Secondary | ICD-10-CM | POA: Diagnosis not present

## 2023-04-05 DIAGNOSIS — R55 Syncope and collapse: Secondary | ICD-10-CM

## 2023-04-05 DIAGNOSIS — D696 Thrombocytopenia, unspecified: Secondary | ICD-10-CM

## 2023-04-05 DIAGNOSIS — F909 Attention-deficit hyperactivity disorder, unspecified type: Secondary | ICD-10-CM

## 2023-04-05 LAB — VITAMIN B12: Vitamin B-12: 1127 pg/mL — ABNORMAL HIGH (ref 211–911)

## 2023-04-05 LAB — HEPATIC FUNCTION PANEL
ALT: 143 U/L — ABNORMAL HIGH (ref 0–53)
AST: 82 U/L — ABNORMAL HIGH (ref 0–37)
Albumin: 4 g/dL (ref 3.5–5.2)
Alkaline Phosphatase: 61 U/L (ref 39–117)
Bilirubin, Direct: 0.1 mg/dL (ref 0.0–0.3)
Total Bilirubin: 0.6 mg/dL (ref 0.2–1.2)
Total Protein: 6.5 g/dL (ref 6.0–8.3)

## 2023-04-05 LAB — PROTIME-INR
INR: 1.1 ratio — ABNORMAL HIGH (ref 0.8–1.0)
Prothrombin Time: 11.6 s (ref 9.6–13.1)

## 2023-04-05 LAB — FOLATE: Folate: 15.7 ng/mL (ref >5.9)

## 2023-04-05 MED ORDER — LISDEXAMFETAMINE DIMESYLATE 30 MG PO CAPS
30.0000 mg | ORAL_CAPSULE | Freq: Every day | ORAL | 0 refills | Status: DC
  Filled 2023-04-05: qty 30, 30d supply, fill #0

## 2023-04-05 NOTE — Patient Instructions (Signed)
      Blood work was ordered.   The lab is on the first floor.    Medications changes include :   none    A referral was ordered cardiology and someone will call you to schedule an appointment.    An Echo (ultrasound of your heart) was ordered.

## 2023-04-05 NOTE — Progress Notes (Signed)
Subjective:    Patient ID: Antonio James, male    DOB: 06-14-1986, 37 y.o.   MRN: [id number]      HPI Antonio James is here for  Chief Complaint  Patient presents with   Dizziness    Larey Seat yesterday (from being dizzy): Describes being dizzy, when he got in his car he became dizzy and fell out. Remembers being surrounded by people and didn't remember much after falling.     Yesterday  -he was here for an appointment.  Yesterday he ate and drank normally.  Left work at 5 p.m. to go home.  Got to car and had just gotten in and started to feel a little anxious, felt weird feels a little lightheaded/dizzy and nauseous.  His vision started to go black.  He was sweating.  He got out of the car to get more air and did not feel any better was having a hard time standing and felt like he needed to get back in the car, but ended up passing out.  He does not recall all of those events, but it was witnessed.  When he came to he was in the parking lot with several people standing over him.  He was very briefly confused and then felt back to normal.  Someone drove him home.  He denies ever experiencing any chest pain or palpitations.   Felt off all last night - off and tired.  He ate dinner and drinking lots of water.  Feels fine today - a little more tired and not quite his normal self.    Rare alcohol use.  Occasionally taking advil.  Does not take tylenol   Has not gone on any medications or had any unusual meals.  Denies any GI symptoms.  Has been busy at work and may have been feeling slightly more tired over the past couple of weeks, but nothing that was out of the normal for how busy he has been.  He exercises regularly-4 days ago he did do a very mild workout because he felt like he needed a little bit of rest.  Medications and allergies reviewed with patient and updated if appropriate.  Current Outpatient Medications on File Prior to Visit  Medication Sig Dispense Refill    cholecalciferol (VITAMIN D) 1000 UNITS tablet Take 1,000 Units by mouth daily.     clomiPHENE (CLOMID) 50 MG tablet Take 1 tablet (50 mg total) by mouth daily. 30 tablet 11   finasteride (PROPECIA) 1 MG tablet Take 1 mg by mouth daily.     lisdexamfetamine (VYVANSE) 30 MG capsule Take 1 capsule (30 mg total) by mouth daily. 30 capsule 0   methimazole (TAPAZOLE) 5 MG tablet Take 1/2 tablet (2.5 mg total) by mouth daily. 45 tablet 2   multivitamin (ONE-A-DAY MEN'S) TABS tablet Take 1 tablet by mouth daily.     Omega-3 Fatty Acids (FISH OIL PO) Take 15 mLs by mouth daily.     No current facility-administered medications on file prior to visit.    Review of Systems  Constitutional:  Positive for chills (last night when in bed) and fatigue (since episode). Negative for appetite change and fever.  HENT:  Negative for congestion, ear pain, postnasal drip, sinus pain and sore throat.   Respiratory:  Positive for shortness of breath (after incident). Negative for cough and wheezing.   Cardiovascular:  Negative for chest pain and palpitations.  Gastrointestinal:  Negative for abdominal pain, blood in stool, constipation, diarrhea, nausea and  vomiting.       No gerd  Genitourinary:  Negative for dysuria, frequency and hematuria.  Musculoskeletal:  Negative for arthralgias, joint swelling and myalgias.  Skin:  Negative for rash.  Neurological:  Negative for headaches.       Objective:   Vitals:   04/05/23 1050  BP: 116/70  Pulse: 71  Temp: 98.5 F (36.9 C)  SpO2: 98%   BP Readings from Last 3 Encounters:  04/05/23 116/70  04/04/23 124/76  08/30/22 126/80   Wt Readings from Last 3 Encounters:  04/05/23 221 lb (100.2 kg)  04/04/23 224 lb (101.6 kg)  08/30/22 223 lb (101.2 kg)   Body mass index is 28.37 kg/m.    Physical Exam Constitutional:      General: He is not in acute distress.    Appearance: Normal appearance. He is not ill-appearing.  HENT:     Head: Normocephalic and  atraumatic.  Eyes:     Conjunctiva/sclera: Conjunctivae normal.  Cardiovascular:     Rate and Rhythm: Normal rate and regular rhythm.     Heart sounds: Normal heart sounds. No murmur heard. Pulmonary:     Effort: Pulmonary effort is normal. No respiratory distress.     Breath sounds: Normal breath sounds. No wheezing or rales.  Abdominal:     General: There is no distension.     Palpations: Abdomen is soft. There is no mass.     Tenderness: There is no abdominal tenderness. There is no guarding or rebound.  Musculoskeletal:     Right lower leg: No edema.     Left lower leg: No edema.  Skin:    General: Skin is warm and dry.     Coloration: Skin is not jaundiced.     Findings: No rash.  Neurological:     Mental Status: He is alert and oriented to person, place, and time. Mental status is at baseline.  Psychiatric:        Mood and Affect: Mood normal.        Behavior: Behavior normal.            Assessment & Plan:    Syncope: Acute Occurred last evening Had prodromal symptoms - nausea, lightheadedness, vision decreased, sweating ? vasovagal EKG today - NSR 70 bpm, normal EKG,  no change compared to prior EKG from 07/2018 Echo ordered Cardiology referral ordered    Elevated LFTs: New - elevated yesterday w/ routine labs Asymptomatic  Having further labs today to evaluate further Recheck lfts today   I spent 30 minutes dedicated to the care of this patient on the date of this encounter including review of recent labs, imaging, obtaining history, communicating with the patient, ordering medications, tests, and documenting clinical information in the EHR

## 2023-04-11 ENCOUNTER — Ambulatory Visit: Payer: 59 | Admitting: Internal Medicine

## 2023-04-11 ENCOUNTER — Encounter: Payer: Self-pay | Admitting: Internal Medicine

## 2023-04-12 ENCOUNTER — Ambulatory Visit: Payer: 59 | Admitting: Internal Medicine

## 2023-04-12 ENCOUNTER — Encounter: Payer: Self-pay | Admitting: Internal Medicine

## 2023-04-12 VITALS — BP 110/70 | HR 58 | Ht 74.0 in | Wt 222.0 lb

## 2023-04-12 DIAGNOSIS — E0590 Thyrotoxicosis, unspecified without thyrotoxic crisis or storm: Secondary | ICD-10-CM | POA: Diagnosis not present

## 2023-04-12 DIAGNOSIS — E0500 Thyrotoxicosis with diffuse goiter without thyrotoxic crisis or storm: Secondary | ICD-10-CM | POA: Diagnosis not present

## 2023-04-12 DIAGNOSIS — R7989 Other specified abnormal findings of blood chemistry: Secondary | ICD-10-CM

## 2023-04-12 DIAGNOSIS — D72820 Lymphocytosis (symptomatic): Secondary | ICD-10-CM | POA: Diagnosis not present

## 2023-04-12 NOTE — Progress Notes (Unsigned)
Name: Antonio James  MRN/ DOB: [id number], 11-16-1985    Age/ Sex: 37 y.o., male     PCP: Etta Grandchild, MD   Reason for Endocrinology Evaluation: Hyperthyroidism     Initial Endocrinology Clinic Visit: 07/27/2020    PATIENT IDENTIFIER: Antonio James is a 37 y.o., male with a past medical history of ADHD and hyperthyroidism. He has followed with [facility] since 07/27/2020 for consultative assistance with management of his Hyperthyroidism.   HISTORICAL SUMMARY: The patient was first diagnosed with hyperthyroidism in 07/2020 TSH at 0.01 uIU/ML with elevated total T4 at 11.5 mcg/dL.   TRAb elevated at 24.63 IU/L   Methimazole started 07/2020, patient self discontinued 03/2023  No FH of thyroid disease  SUBJECTIVE:    Today (04/15/2023):  Antonio James is here for a follow up on hyperthyroidism secondary to Graves' Disease.     He has been off methimazole for the past approximately 2 weeks, in an attempt to wean himself off ( late July)   He has been on Clomid for many years   Patient has been noted with weight loss Work is stressful  Denies abdominal pain  Denies constipation or diarrhea  Denies local neck symptoms  Denies eye symptoms  Denies tremors or palpitations   Recent labs showed new elevated LFTs, workup through PCP has been negative, patient denies any symptoms, no recent travel or OTC supplements  HISTORY:  Past Medical History:  Past Medical History:  Diagnosis Date   Adult ADHD    Depression    counseling throughout his life, no meds   GERD (gastroesophageal reflux disease)    Left hip pain    Intermittent, has known torn labrum in left hip   Panic attacks    Situational anxiety    Past Surgical History:  Past Surgical History:  Procedure Laterality Date   WISDOM TOOTH EXTRACTION     No complications   Social History:  reports that he has quit smoking. His smoking use included e-cigarettes. He has quit using smokeless  tobacco. He reports current alcohol use. He reports that he does not use drugs. Family History:  Family History  Problem Relation Age of Onset   Cancer Mother        breast   Cancer Maternal Grandmother    Heart disease Maternal Grandfather    Stroke Maternal Grandfather    Diabetes Paternal Grandmother    Heart attack Paternal Grandfather    Other Paternal Grandfather        hypogonadism     HOME MEDICATIONS: Allergies as of 04/12/2023       Reactions   Sulfa Antibiotics Hives, Rash        Medication List        Accurate as of April 12, 2023 11:59 PM. If you have any questions, ask your nurse or doctor.          cholecalciferol 1000 units tablet Commonly known as: VITAMIN D Take 1,000 Units by mouth daily.   Clomid 50 MG tablet Generic drug: clomiPHENE Take 1 tablet (50 mg total) by mouth daily.   finasteride 1 MG tablet Commonly known as: PROPECIA Take 1 mg by mouth daily.   FISH OIL PO Take 15 mLs by mouth daily.   lisdexamfetamine 30 MG capsule Commonly known as: Vyvanse Take 1 capsule (30 mg total) by mouth daily.   methimazole 5 MG tablet Commonly known as: TAPAZOLE Take 1/2 tablet (2.5 mg total) by mouth daily.  multivitamin Tabs tablet Take 1 tablet by mouth daily.          OBJECTIVE:   PHYSICAL EXAM: VS: BP 110/70 (BP Location: Left Arm, Patient Position: Sitting, Cuff Size: Large)   Pulse (!) 58   Ht 6\' 2"  (1.88 m)   Wt 222 lb (100.7 kg)   SpO2 99%   BMI 28.50 kg/m      Filed Weights   04/12/23 1428  Weight: 222 lb (100.7 kg)     EXAM: General: Pt appears well and is in NAD  Neck: General: Supple without adenopathy. Thyroid: Thyroid size normal.  No goiter or nodules appreciated.  Lungs: Clear with good BS bilat   Heart: Auscultation: RRR.  Abdomen: soft, nontender  Extremities:  BL LE: No pretibial edema   Mental Status: Judgment, insight: Intact Orientation: Oriented to time, place, and person Mood and affect:  No depression, anxiety, or agitation     DATA REVIEWED:   Latest Reference Range & Units 04/04/23 14:08  TSH 0.35 - 5.50 uIU/mL 2.02    Latest Reference Range & Units 04/04/23 14:08 04/05/23 11:30  AST 0 - 37 U/L 75 (H) 82 (H)  ALT 0 - 53 U/L 119 (H) 143 (H)  Total Protein 6.0 - 8.3 g/dL 6.7 6.5  Bilirubin, Direct 0.0 - 0.3 mg/dL 0.1 0.1  Total Bilirubin 0.2 - 1.2 mg/dL 0.8 0.6  GFR >16.10 mL/min 82.87   Total CHOL/HDL Ratio  5   Cholesterol 0 - 200 mg/dL 960   HDL Cholesterol >45.40 mg/dL 98.11 (L)   LDL (calc) 0 - 99 mg/dL 914 (H)   NonHDL  782.95   Triglycerides 0.0 - 149.0 mg/dL 621.3   VLDL 0.0 - 08.6 mg/dL 57.8     ASSESSMENT / PLAN / RECOMMENDATIONS:   Hyperthyroidism secondary to Graves' Disease  - Pt is clinically euthyroid  - No local neck symptoms -He has been off methimazole for approximately 2 weeks, recent TFTs are normal, we discussed rechecking in 4 weeks  Medications  N/a     2. Graves' Disease:  - No extra thyroidal manifestations of Graves' disease   3.  Elevated LFTs:   -This is new, I do not think this is related to methimazole as he has been on methimazole for years without any side effects and he was on a small dose.  I suspect this is infectious in nature as he has been also noted with lymphocytosis -Workup through PCP has been and revealing -Will recheck in 4 weeks unless he has a follow-up sooner with his PCP   F/U in 6 months      Signed electronically by: Lyndle Herrlich, MD  [facility]  [facility] [address] [place] [place], [place] [address] Phone: [phone] FAX: [phone]      CC: Etta Grandchild, MD [address] [place] [address] Phone: [phone]  Fax: [phone]   Return to Endocrinology clinic as below: Future Appointments  Date Time Provider Department Center  05/03/2023 10:00 AM [facility]ECHO/VAS [facility]  06/13/2023  8:15 AM [facility]/NEURO LAB  [facility] None  07/08/2023  8:40 AM Jodelle Red, MD [facility]  09/18/2023  7:50 AM Apple Dearmas, Konrad Dolores, MD [facility] None

## 2023-05-03 ENCOUNTER — Other Ambulatory Visit (HOSPITAL_BASED_OUTPATIENT_CLINIC_OR_DEPARTMENT_OTHER): Payer: 59

## 2023-05-21 ENCOUNTER — Other Ambulatory Visit (HOSPITAL_COMMUNITY): Payer: Self-pay

## 2023-05-21 ENCOUNTER — Other Ambulatory Visit: Payer: Self-pay | Admitting: Internal Medicine

## 2023-05-21 DIAGNOSIS — F909 Attention-deficit hyperactivity disorder, unspecified type: Secondary | ICD-10-CM

## 2023-05-21 MED ORDER — LISDEXAMFETAMINE DIMESYLATE 30 MG PO CAPS
30.0000 mg | ORAL_CAPSULE | Freq: Every day | ORAL | 0 refills | Status: DC
Start: 2023-05-21 — End: 2023-07-07
  Filled 2023-05-21: qty 30, 30d supply, fill #0

## 2023-05-23 ENCOUNTER — Other Ambulatory Visit (HOSPITAL_COMMUNITY): Payer: Self-pay

## 2023-05-28 ENCOUNTER — Other Ambulatory Visit (HOSPITAL_BASED_OUTPATIENT_CLINIC_OR_DEPARTMENT_OTHER): Payer: 59

## 2023-06-13 ENCOUNTER — Other Ambulatory Visit: Payer: 59

## 2023-06-20 ENCOUNTER — Other Ambulatory Visit (INDEPENDENT_AMBULATORY_CARE_PROVIDER_SITE_OTHER): Payer: 59

## 2023-06-20 DIAGNOSIS — D72820 Lymphocytosis (symptomatic): Secondary | ICD-10-CM

## 2023-06-20 DIAGNOSIS — E0590 Thyrotoxicosis, unspecified without thyrotoxic crisis or storm: Secondary | ICD-10-CM

## 2023-06-20 LAB — CBC WITH DIFFERENTIAL/PLATELET
Basophils Absolute: 0 10*3/uL (ref 0.0–0.1)
Basophils Relative: 0.3 % (ref 0.0–3.0)
Eosinophils Absolute: 0 10*3/uL (ref 0.0–0.7)
Eosinophils Relative: 1 % (ref 0.0–5.0)
HCT: 51.4 % (ref 39.0–52.0)
Hemoglobin: 17 g/dL (ref 13.0–17.0)
Lymphocytes Relative: 29.2 % (ref 12.0–46.0)
Lymphs Abs: 1.5 10*3/uL (ref 0.7–4.0)
MCHC: 33.1 g/dL (ref 30.0–36.0)
MCV: 89.8 fL (ref 78.0–100.0)
Monocytes Absolute: 0.4 10*3/uL (ref 0.1–1.0)
Monocytes Relative: 7.7 % (ref 3.0–12.0)
Neutro Abs: 3.1 10*3/uL (ref 1.4–7.7)
Neutrophils Relative %: 61.8 % (ref 43.0–77.0)
Platelets: 150 10*3/uL (ref 150.0–400.0)
RBC: 5.72 Mil/uL (ref 4.22–5.81)
RDW: 14.6 % (ref 11.5–15.5)
WBC: 5.1 10*3/uL (ref 4.0–10.5)

## 2023-06-20 LAB — T4, FREE: Free T4: 1.03 ng/dL (ref 0.60–1.60)

## 2023-06-20 LAB — COMPREHENSIVE METABOLIC PANEL
ALT: 34 U/L (ref 0–53)
AST: 29 U/L (ref 0–37)
Albumin: 4.3 g/dL (ref 3.5–5.2)
Alkaline Phosphatase: 54 U/L (ref 39–117)
BUN: 23 mg/dL (ref 6–23)
CO2: 29 meq/L (ref 19–32)
Calcium: 9.1 mg/dL (ref 8.4–10.5)
Chloride: 101 meq/L (ref 96–112)
Creatinine, Ser: 1.17 mg/dL (ref 0.40–1.50)
GFR: 79.36 mL/min (ref >60.00)
Glucose, Bld: 87 mg/dL (ref 70–99)
Potassium: 4.2 meq/L (ref 3.5–5.1)
Sodium: 138 meq/L (ref 135–145)
Total Bilirubin: 1 mg/dL (ref 0.2–1.2)
Total Protein: 6.7 g/dL (ref 6.0–8.3)

## 2023-06-20 LAB — GAMMA GT: GGT: 13 U/L (ref 7–51)

## 2023-06-20 LAB — TSH: TSH: 2.28 u[IU]/mL (ref 0.35–5.50)

## 2023-06-20 NOTE — Addendum Note (Signed)
Addended by: Gertie Baron D on: 06/20/2023 08:51 AM   Modules accepted: Orders

## 2023-06-21 LAB — T3: T3, Total: 104 ng/dL (ref 71–180)

## 2023-06-24 ENCOUNTER — Other Ambulatory Visit (HOSPITAL_BASED_OUTPATIENT_CLINIC_OR_DEPARTMENT_OTHER): Payer: 59

## 2023-07-07 ENCOUNTER — Other Ambulatory Visit: Payer: Self-pay | Admitting: Internal Medicine

## 2023-07-07 DIAGNOSIS — F909 Attention-deficit hyperactivity disorder, unspecified type: Secondary | ICD-10-CM

## 2023-07-07 MED ORDER — LISDEXAMFETAMINE DIMESYLATE 30 MG PO CAPS
30.0000 mg | ORAL_CAPSULE | Freq: Every day | ORAL | 0 refills | Status: DC
Start: 2023-07-07 — End: 2023-08-14
  Filled 2023-07-07: qty 30, 30d supply, fill #0

## 2023-07-08 ENCOUNTER — Other Ambulatory Visit (HOSPITAL_COMMUNITY): Payer: Self-pay

## 2023-07-08 ENCOUNTER — Ambulatory Visit (HOSPITAL_BASED_OUTPATIENT_CLINIC_OR_DEPARTMENT_OTHER): Payer: 59 | Admitting: Cardiology

## 2023-07-26 ENCOUNTER — Other Ambulatory Visit (HOSPITAL_BASED_OUTPATIENT_CLINIC_OR_DEPARTMENT_OTHER): Payer: 59

## 2023-08-13 ENCOUNTER — Encounter (HOSPITAL_BASED_OUTPATIENT_CLINIC_OR_DEPARTMENT_OTHER): Payer: Self-pay

## 2023-08-14 ENCOUNTER — Other Ambulatory Visit: Payer: Self-pay | Admitting: Internal Medicine

## 2023-08-14 ENCOUNTER — Other Ambulatory Visit (HOSPITAL_COMMUNITY): Payer: Self-pay

## 2023-08-14 DIAGNOSIS — F909 Attention-deficit hyperactivity disorder, unspecified type: Secondary | ICD-10-CM

## 2023-08-14 MED ORDER — LISDEXAMFETAMINE DIMESYLATE 30 MG PO CAPS
30.0000 mg | ORAL_CAPSULE | Freq: Every day | ORAL | 0 refills | Status: DC
Start: 2023-08-14 — End: 2023-10-02
  Filled 2023-08-14: qty 30, 30d supply, fill #0

## 2023-09-18 ENCOUNTER — Ambulatory Visit: Payer: 59 | Admitting: Internal Medicine

## 2023-09-18 NOTE — Progress Notes (Deleted)
Name: Antonio James  MRN/ DOB: [id number], April 08, 1986    Age/ Sex: 38 y.o., male     PCP: Etta Grandchild, MD   Reason for Endocrinology Evaluation: Hyperthyroidism     Initial Endocrinology Clinic Visit: 07/27/2020    PATIENT IDENTIFIER: [name] is a 38 y.o., male with a past medical history of ADHD and hyperthyroidism. He has followed with [facility] since 07/27/2020 for consultative assistance with management of his Hyperthyroidism.   HISTORICAL SUMMARY: The patient was first diagnosed with hyperthyroidism in 07/2020 TSH at 0.01 uIU/ML with elevated total T4 at 11.5 mcg/dL.   TRAb elevated at 24.63 IU/L   Methimazole started 07/2020, patient self discontinued 03/2023  No FH of thyroid disease  SUBJECTIVE:    Today (09/18/2023):  Antonio James is here for a follow up on hyperthyroidism secondary to Graves' Disease.     He has been off methimazole for the past approximately 2 weeks, in an attempt to wean himself off ( late July)   He has been on Clomid for many years   Patient has been noted with weight loss Work is stressful  Denies abdominal pain  Denies constipation or diarrhea  Denies local neck symptoms  Denies eye symptoms  Denies tremors or palpitations     HISTORY:  Past Medical History:  Past Medical History:  Diagnosis Date   Adult ADHD    Depression    counseling throughout his life, no meds   GERD (gastroesophageal reflux disease)    Left hip pain    Intermittent, has known torn labrum in left hip   Panic attacks    Situational anxiety    Past Surgical History:  Past Surgical History:  Procedure Laterality Date   WISDOM TOOTH EXTRACTION     No complications   Social History:  reports that he has quit smoking. His smoking use included e-cigarettes. He has quit using smokeless tobacco. He reports current alcohol use. He reports that he does not use drugs. Family History:  Family History  Problem Relation Age of  Onset   Cancer Mother        breast   Cancer Maternal Grandmother    Heart disease Maternal Grandfather    Stroke Maternal Grandfather    Diabetes Paternal Grandmother    Heart attack Paternal Grandfather    Other Paternal Grandfather        hypogonadism     HOME MEDICATIONS: Allergies as of 09/18/2023       Reactions   Sulfa Antibiotics Hives, Rash        Medication List        Accurate as of September 18, 2023  6:54 AM. If you have any questions, ask your nurse or doctor.          cholecalciferol 1000 units tablet Commonly known as: VITAMIN D Take 1,000 Units by mouth daily.   Clomid 50 MG tablet Generic drug: clomiPHENE Take 1 tablet (50 mg total) by mouth daily.   finasteride 1 MG tablet Commonly known as: PROPECIA Take 1 mg by mouth daily.   FISH OIL PO Take 15 mLs by mouth daily.   lisdexamfetamine 30 MG capsule Commonly known as: Vyvanse Take 1 capsule (30 mg total) by mouth daily.   methimazole 5 MG tablet Commonly known as: TAPAZOLE Take 1/2 tablet (2.5 mg total) by mouth daily.   multivitamin Tabs tablet Take 1 tablet by mouth daily.  OBJECTIVE:   PHYSICAL EXAM: VS: There were no vitals taken for this visit.     There were no vitals filed for this visit.    EXAM: General: Pt appears well and is in NAD  Neck: General: Supple without adenopathy. Thyroid: Thyroid size normal.  No goiter or nodules appreciated.  Lungs: Clear with good BS bilat   Heart: Auscultation: RRR.  Abdomen: soft, nontender  Extremities:  BL LE: No pretibial edema   Mental Status: Judgment, insight: Intact Orientation: Oriented to time, place, and person Mood and affect: No depression, anxiety, or agitation     DATA REVIEWED:  Latest Reference Range & Units 06/20/23 08:52  Sodium 135 - 145 mEq/L 138  Potassium 3.5 - 5.1 mEq/L 4.2  Chloride 96 - 112 mEq/L 101  CO2 19 - 32 mEq/L 29  Glucose 70 - 99 mg/dL 87  BUN 6 - 23 mg/dL 23   Creatinine 8.65 - 1.50 mg/dL 7.84  Calcium 8.4 - 69.6 mg/dL 9.1  Alkaline Phosphatase 39 - 117 U/L 54  Albumin 3.5 - 5.2 g/dL 4.3  AST 0 - 37 U/L 29  ALT 0 - 53 U/L 34  Total Protein 6.0 - 8.3 g/dL 6.7  Total Bilirubin 0.2 - 1.2 mg/dL 1.0  GGT 7 - 51 U/L 13  GFR >60.00 mL/min 79.36     ASSESSMENT / PLAN / RECOMMENDATIONS:   Hyperthyroidism secondary to Graves' Disease  - Pt is clinically euthyroid  - No local neck symptoms -He has been off methimazole for approximately 2 weeks, recent TFTs are normal, we discussed rechecking in 4 weeks  Medications  N/a     2. Graves' Disease:  - No extra thyroidal manifestations of Graves' disease     F/U in 6 months      Signed electronically by: Lyndle Herrlich, MD  [facility]  [facility] [address] [place] [place], [place] [address] Phone: [phone] FAX: [phone]      CC: Etta Grandchild, MD [address] [place] [address] Phone: [phone]  Fax: [phone]   Return to Endocrinology clinic as below: Future Appointments  Date Time Provider Department Center  09/18/2023  7:50 AM Gittel Mccamish, Konrad Dolores, MD [facility] None

## 2023-10-02 ENCOUNTER — Other Ambulatory Visit (HOSPITAL_COMMUNITY): Payer: Self-pay

## 2023-10-02 ENCOUNTER — Other Ambulatory Visit: Payer: Self-pay | Admitting: Internal Medicine

## 2023-10-02 DIAGNOSIS — F909 Attention-deficit hyperactivity disorder, unspecified type: Secondary | ICD-10-CM

## 2023-10-02 MED ORDER — LISDEXAMFETAMINE DIMESYLATE 30 MG PO CAPS
30.0000 mg | ORAL_CAPSULE | Freq: Every day | ORAL | 0 refills | Status: DC
Start: 2023-10-02 — End: 2023-11-14
  Filled 2023-10-02: qty 30, 30d supply, fill #0

## 2023-11-08 ENCOUNTER — Other Ambulatory Visit: Payer: Self-pay | Admitting: Family

## 2023-11-08 DIAGNOSIS — F909 Attention-deficit hyperactivity disorder, unspecified type: Secondary | ICD-10-CM

## 2023-11-12 ENCOUNTER — Encounter: Payer: Self-pay | Admitting: Internal Medicine

## 2023-11-12 ENCOUNTER — Encounter (HOSPITAL_COMMUNITY): Payer: Self-pay

## 2023-11-12 ENCOUNTER — Other Ambulatory Visit (HOSPITAL_COMMUNITY): Payer: Self-pay

## 2023-11-12 ENCOUNTER — Other Ambulatory Visit: Payer: Self-pay

## 2023-11-12 DIAGNOSIS — F909 Attention-deficit hyperactivity disorder, unspecified type: Secondary | ICD-10-CM

## 2023-11-13 ENCOUNTER — Other Ambulatory Visit: Payer: Self-pay

## 2023-11-13 DIAGNOSIS — F909 Attention-deficit hyperactivity disorder, unspecified type: Secondary | ICD-10-CM

## 2023-11-14 ENCOUNTER — Other Ambulatory Visit: Payer: Self-pay

## 2023-11-14 DIAGNOSIS — F909 Attention-deficit hyperactivity disorder, unspecified type: Secondary | ICD-10-CM

## 2023-11-16 ENCOUNTER — Other Ambulatory Visit: Payer: Self-pay | Admitting: Family

## 2023-11-16 DIAGNOSIS — F909 Attention-deficit hyperactivity disorder, unspecified type: Secondary | ICD-10-CM

## 2023-11-19 ENCOUNTER — Other Ambulatory Visit (HOSPITAL_COMMUNITY): Payer: Self-pay

## 2023-11-19 ENCOUNTER — Other Ambulatory Visit: Payer: Self-pay

## 2023-11-19 MED ORDER — LISDEXAMFETAMINE DIMESYLATE 30 MG PO CAPS
30.0000 mg | ORAL_CAPSULE | Freq: Every day | ORAL | 0 refills | Status: DC
  Filled 2023-11-19: qty 30, 30d supply, fill #0

## 2023-11-27 ENCOUNTER — Other Ambulatory Visit (HOSPITAL_COMMUNITY): Payer: Self-pay

## 2023-12-16 ENCOUNTER — Encounter: Payer: Self-pay | Admitting: Internal Medicine

## 2023-12-16 ENCOUNTER — Ambulatory Visit (INDEPENDENT_AMBULATORY_CARE_PROVIDER_SITE_OTHER): Admitting: Internal Medicine

## 2023-12-16 ENCOUNTER — Other Ambulatory Visit (HOSPITAL_COMMUNITY): Payer: Self-pay

## 2023-12-16 VITALS — BP 132/86 | HR 71 | Temp 98.2°F | Resp 16 | Ht 74.0 in | Wt 224.8 lb

## 2023-12-16 DIAGNOSIS — F909 Attention-deficit hyperactivity disorder, unspecified type: Secondary | ICD-10-CM

## 2023-12-16 DIAGNOSIS — R030 Elevated blood-pressure reading, without diagnosis of hypertension: Secondary | ICD-10-CM | POA: Diagnosis not present

## 2023-12-16 DIAGNOSIS — Z0000 Encounter for general adult medical examination without abnormal findings: Secondary | ICD-10-CM

## 2023-12-16 DIAGNOSIS — Z0001 Encounter for general adult medical examination with abnormal findings: Secondary | ICD-10-CM | POA: Insufficient documentation

## 2023-12-16 MED ORDER — LISDEXAMFETAMINE DIMESYLATE 30 MG PO CAPS
30.0000 mg | ORAL_CAPSULE | Freq: Every day | ORAL | 0 refills | Status: DC
  Filled 2023-12-16: qty 90, 90d supply, fill #0
  Filled 2023-12-19: qty 30, 30d supply, fill #0
  Filled 2024-01-22: qty 30, 30d supply, fill #1

## 2023-12-16 NOTE — Progress Notes (Unsigned)
 Subjective:  Patient ID: Antonio James, male    DOB: 06-Sep-1985  Age: 38 y.o. MRN: [id number]  CC: Annual Exam and Hypertension   HPI SAMARTH OGLE presents for a CPX and f/up -----  Discussed the use of AI scribe software for clinical note transcription with the patient, who gave verbal consent to proceed.  History of Present Illness   Antonio James is a 38 year old male with Graves' disease who presents with elevated blood pressure.  He has elevated blood pressure but is asymptomatic, with no headache, blurred vision, chest pain, or shortness of breath. His blood pressure is usually normal, and he attributes the recent elevation to travel and lack of sleep. He has not eaten today, which is unusual for him as he typically meal preps for the week. No dizziness, lightheadedness, palpitations, heart racing, or swelling are present. He has reduced his medication from 40 mg to 30 mg without side effects and reports trouble sleeping, which he attributes to his children.  He has a history of Graves' disease and has been off medication for about eight to nine months. His weight is stable, and he has no symptoms of hyperthyroidism such as palpitations, anxiety attacks, or unexplained weight loss. Initially, he lost 15-20 pounds over a three to four month period when his thyroid condition was first triggered three years ago.       Outpatient Medications Prior to Visit  Medication Sig Dispense Refill   BORON PO Take by mouth daily.     cholecalciferol (VITAMIN D) 1000 UNITS tablet Take 1,000 Units by mouth daily.     clomiPHENE (CLOMID) 50 MG tablet Take 1 tablet (50 mg total) by mouth daily. 30 tablet 11   FINASTERIDE-MINOXIDIL EX Apply topically daily.     MAGNESIUM PO Take by mouth daily.     multivitamin (ONE-A-DAY MEN'S) TABS tablet Take 1 tablet by mouth daily.     Omega-3 Fatty Acids (FISH OIL PO) Take 15 mLs by mouth daily.     lisdexamfetamine (VYVANSE) 30 MG capsule Take 1  capsule (30 mg total) by mouth daily. 30 capsule 0   finasteride (PROPECIA) 1 MG tablet Take 1 mg by mouth daily.     methimazole (TAPAZOLE) 5 MG tablet Take 1/2 tablet (2.5 mg total) by mouth daily. 45 tablet 2   No facility-administered medications prior to visit.    ROS Review of Systems  Constitutional: Negative.  Negative for appetite change, chills, diaphoresis, fatigue and fever.  HENT: Negative.    Respiratory: Negative.  Negative for cough, chest tightness, shortness of breath and wheezing.   Cardiovascular:  Negative for chest pain, palpitations and leg swelling.  Gastrointestinal: Negative.  Negative for abdominal pain and nausea.  Genitourinary: Negative.  Negative for difficulty urinating.  Musculoskeletal: Negative.  Negative for arthralgias.  Skin: Negative.   Neurological: Negative.  Negative for dizziness and weakness.  Hematological:  Negative for adenopathy. Does not bruise/bleed easily.  Psychiatric/Behavioral:  Positive for decreased concentration. Negative for behavioral problems, confusion, self-injury and suicidal ideas. The patient is not nervous/anxious and is not hyperactive.     Objective:  BP 132/86 (BP Location: Left Arm, Patient Position: Sitting, Cuff Size: Large)   Pulse 71   Temp 98.2 F (36.8 C) (Oral)   Resp 16   Ht 6\' 2"  (1.88 m)   Wt 224 lb 12.8 oz (102 kg)   SpO2 97%   BMI 28.86 kg/m   BP Readings from Last 3 Encounters:  12/16/23 132/86  04/12/23 110/70  04/05/23 116/70    Wt Readings from Last 3 Encounters:  12/16/23 224 lb 12.8 oz (102 kg)  04/12/23 222 lb (100.7 kg)  04/05/23 221 lb (100.2 kg)    Physical Exam Vitals reviewed.  Constitutional:      Appearance: Normal appearance.  HENT:     Nose: Nose normal.     Mouth/Throat:     Mouth: Mucous membranes are moist.  Eyes:     General: No scleral icterus.    Conjunctiva/sclera: Conjunctivae normal.  Cardiovascular:     Rate and Rhythm: Normal rate and regular rhythm.      Pulses: Normal pulses.     Heart sounds: No murmur heard.    No friction rub. No gallop.  Pulmonary:     Effort: Pulmonary effort is normal.     Breath sounds: No stridor. No wheezing, rhonchi or rales.  Abdominal:     General: Abdomen is flat.     Palpations: There is no mass.     Tenderness: There is no abdominal tenderness. There is no guarding.     Hernia: No hernia is present.  Musculoskeletal:        General: Normal range of motion.     Cervical back: Neck supple.     Right lower leg: No edema.     Left lower leg: No edema.  Lymphadenopathy:     Cervical: No cervical adenopathy.  Skin:    General: Skin is warm and dry.  Neurological:     General: No focal deficit present.     Mental Status: He is alert. Mental status is at baseline.  Psychiatric:        Mood and Affect: Mood normal.        Behavior: Behavior normal.     Lab Results  Component Value Date   WBC 5.1 06/20/2023   HGB 17.0 06/20/2023   HCT 51.4 06/20/2023   PLT 150.0 06/20/2023   GLUCOSE 87 06/20/2023   CHOL 177 04/04/2023   TRIG 134.0 04/04/2023   HDL 32.50 (L) 04/04/2023   LDLCALC 118 (H) 04/04/2023   ALT 34 06/20/2023   AST 29 06/20/2023   NA 138 06/20/2023   K 4.2 06/20/2023   CL 101 06/20/2023   CREATININE 1.17 06/20/2023   BUN 23 06/20/2023   CO2 29 06/20/2023   TSH 2.28 06/20/2023   PSA 0.40 04/04/2023   INR 1.1 (H) 04/05/2023    VAS [place] UPPER EXTREMITY VENOUS DUPLEX Result Date: 02/17/2021 UPPER VENOUS STUDY  Patient Name:  Antonio James [facility]  Date of Exam:   02/16/2021 Medical Rec #: [id number]        Accession #:    [id number] Date of Birth: 05-12-1986       Patient Gender: M Patient Age:   67Y Exam Location:  [facility] Procedure:      VAS [place] UPPER EXTREMITY VENOUS DUPLEX Referring Phys: [id number] STACY J BURNS --------------------------------------------------------------------------------  Other Indications: Right arm heaviness for the past nine months; "feels like blood pooling". He  denies any pain. Risk Factors: None identified. Performing Technologist: Dondra Prader RVT  Examination Guidelines: A complete evaluation includes B-mode imaging, spectral Doppler, color Doppler, and power Doppler as needed of all accessible portions of each vessel. Bilateral testing is considered an integral part of a complete examination. Limited examinations for reoccurring indications may be performed as noted.  Right Findings: +----------+------------+---------+-----------+----------+-------+ RIGHT     CompressiblePhasicitySpontaneousPropertiesSummary +----------+------------+---------+-----------+----------+-------+ IJV  Full       Yes       Yes                      +----------+------------+---------+-----------+----------+-------+ Subclavian               Yes       Yes                      +----------+------------+---------+-----------+----------+-------+ Axillary      Full       Yes       Yes                      +----------+------------+---------+-----------+----------+-------+ Brachial      Full       Yes       Yes                      +----------+------------+---------+-----------+----------+-------+ Radial        Full       Yes       Yes                      +----------+------------+---------+-----------+----------+-------+ Ulnar         Full       Yes       Yes                      +----------+------------+---------+-----------+----------+-------+ Cephalic      Full       Yes       Yes                      +----------+------------+---------+-----------+----------+-------+ Basilic       Full       Yes       Yes                      +----------+------------+---------+-----------+----------+-------+  Left Findings: +----------+------------+---------+-----------+----------+-------+ LEFT      CompressiblePhasicitySpontaneousPropertiesSummary +----------+------------+---------+-----------+----------+-------+ IJV           Full        Yes       Yes                      +----------+------------+---------+-----------+----------+-------+ Subclavian               Yes       Yes                      +----------+------------+---------+-----------+----------+-------+  Summary:  Right: No evidence of deep vein thrombosis in the upper extremity. No evidence of superficial vein thrombosis in the upper extremity. No evidence of thrombosis in the subclavian.  Left: No evidence of thrombosis in the subclavian.  *See table(s) above for measurements and observations.  Diagnosing physician: Nanetta Batty MD Electronically signed by Nanetta Batty MD on 02/17/2021 at 11:26:46 AM.    Final     Assessment & Plan:  Encounter for general adult medical examination with abnormal findings  Adult ADHD -     Lisdexamfetamine Dimesylate; Take 1 capsule (30 mg total) by mouth daily.  Dispense: 90 capsule; Refill: 0  Prehypertension     Follow-up: No follow-ups on file.  Sanda Linger, MD

## 2023-12-19 ENCOUNTER — Other Ambulatory Visit (HOSPITAL_COMMUNITY): Payer: Self-pay

## 2023-12-19 ENCOUNTER — Encounter: Admitting: Internal Medicine

## 2023-12-19 NOTE — Patient Instructions (Signed)
 Health Maintenance, Male  Adopting a healthy lifestyle and getting preventive care are important in promoting health and wellness. Ask your health care provider about:  The right schedule for you to have regular tests and exams.  Things you can do on your own to prevent diseases and keep yourself healthy.  What should I know about diet, weight, and exercise?  Eat a healthy diet    Eat a diet that includes plenty of vegetables, fruits, low-fat dairy products, and lean protein.  Do not eat a lot of foods that are high in solid fats, added sugars, or sodium.  Maintain a healthy weight  Body mass index (BMI) is a measurement that can be used to identify possible weight problems. It estimates body fat based on height and weight. Your health care provider can help determine your BMI and help you achieve or maintain a healthy weight.  Get regular exercise  Get regular exercise. This is one of the most important things you can do for your health. Most adults should:  Exercise for at least 150 minutes each week. The exercise should increase your heart rate and make you sweat (moderate-intensity exercise).  Do strengthening exercises at least twice a week. This is in addition to the moderate-intensity exercise.  Spend less time sitting. Even light physical activity can be beneficial.  Watch cholesterol and blood lipids  Have your blood tested for lipids and cholesterol at 39 years of age, then have this test every 5 years.  You may need to have your cholesterol levels checked more often if:  Your lipid or cholesterol levels are high.  You are older than 39 years of age.  You are at high risk for heart disease.  What should I know about cancer screening?  Many types of cancers can be detected early and may often be prevented. Depending on your health history and family history, you may need to have cancer screening at various ages. This may include screening for:  Colorectal cancer.  Prostate cancer.  Skin cancer.  Lung  cancer.  What should I know about heart disease, diabetes, and high blood pressure?  Blood pressure and heart disease  High blood pressure causes heart disease and increases the risk of stroke. This is more likely to develop in people who have high blood pressure readings or are overweight.  Talk with your health care provider about your target blood pressure readings.  Have your blood pressure checked:  Every 3-5 years if you are 9-95 years of age.  Every year if you are 85 years old or older.  If you are between the ages of 29 and 29 and are a current or former smoker, ask your health care provider if you should have a one-time screening for abdominal aortic aneurysm (AAA).  Diabetes  Have regular diabetes screenings. This checks your fasting blood sugar level. Have the screening done:  Once every three years after age 23 if you are at a normal weight and have a low risk for diabetes.  More often and at a younger age if you are overweight or have a high risk for diabetes.  What should I know about preventing infection?  Hepatitis B  If you have a higher risk for hepatitis B, you should be screened for this virus. Talk with your health care provider to find out if you are at risk for hepatitis B infection.  Hepatitis C  Blood testing is recommended for:  Everyone born from [birth year] through 1966.  Anyone  with known risk factors for hepatitis C.  Sexually transmitted infections (STIs)  You should be screened each year for STIs, including gonorrhea and chlamydia, if:  You are sexually active and are younger than 39 years of age.  You are older than 39 years of age and your health care provider tells you that you are at risk for this type of infection.  Your sexual activity has changed since you were last screened, and you are at increased risk for chlamydia or gonorrhea. Ask your health care provider if you are at risk.  Ask your health care provider about whether you are at high risk for HIV. Your health care provider  may recommend a prescription medicine to help prevent HIV infection. If you choose to take medicine to prevent HIV, you should first get tested for HIV. You should then be tested every 3 months for as long as you are taking the medicine.  Follow these instructions at home:  Alcohol use  Do not drink alcohol if your health care provider tells you not to drink.  If you drink alcohol:  Limit how much you have to 0-2 drinks a day.  Know how much alcohol is in your drink. In the [place] one drink equals one 12 oz bottle of beer (355 mL), one 5 oz glass of wine (148 mL), or one 1 oz glass of hard liquor (44 mL).  Lifestyle  Do not use any products that contain nicotine or tobacco. These products include cigarettes, chewing tobacco, and vaping devices, such as e-cigarettes. If you need help quitting, ask your health care provider.  Do not use street drugs.  Do not share needles.  Ask your health care provider for help if you need support or information about quitting drugs.  General instructions  Schedule regular health, dental, and eye exams.  Stay current with your vaccines.  Tell your health care provider if:  You often feel depressed.  You have ever been abused or do not feel safe at home.  Summary  Adopting a healthy lifestyle and getting preventive care are important in promoting health and wellness.  Follow your health care provider's instructions about healthy diet, exercising, and getting tested or screened for diseases.  Follow your health care provider's instructions on monitoring your cholesterol and blood pressure.  This information is not intended to replace advice given to you by your health care provider. Make sure you discuss any questions you have with your health care provider.  Document Revised: 01/09/2021 Document Reviewed: 01/09/2021  Elsevier Patient Education  2024 [employer].

## 2024-01-22 ENCOUNTER — Other Ambulatory Visit (HOSPITAL_COMMUNITY): Payer: Self-pay

## 2024-01-22 ENCOUNTER — Other Ambulatory Visit: Payer: Self-pay | Admitting: Internal Medicine

## 2024-01-22 DIAGNOSIS — F909 Attention-deficit hyperactivity disorder, unspecified type: Secondary | ICD-10-CM

## 2024-01-22 MED ORDER — LISDEXAMFETAMINE DIMESYLATE 30 MG PO CAPS
30.0000 mg | ORAL_CAPSULE | Freq: Every day | ORAL | 0 refills | Status: DC
  Filled 2024-01-22: qty 30, 30d supply, fill #0
  Filled 2024-02-19: qty 30, 30d supply, fill #1
  Filled 2024-03-26: qty 30, 30d supply, fill #2

## 2024-03-19 ENCOUNTER — Telehealth: Admitting: Physician Assistant

## 2024-03-19 ENCOUNTER — Other Ambulatory Visit (HOSPITAL_COMMUNITY): Payer: Self-pay

## 2024-03-19 ENCOUNTER — Encounter: Payer: Self-pay | Admitting: Internal Medicine

## 2024-03-19 DIAGNOSIS — M2550 Pain in unspecified joint: Secondary | ICD-10-CM | POA: Diagnosis not present

## 2024-03-19 MED ORDER — PREDNISONE 10 MG PO TABS
ORAL_TABLET | ORAL | 0 refills | Status: AC
  Filled 2024-03-19: qty 21, 6d supply, fill #0

## 2024-03-19 NOTE — Patient Instructions (Signed)
  Lamar JAYSON Reveal, thank you for joining Elsie Velma Lunger, PA-C for today's virtual visit.  While this provider is not your primary care provider (PCP), if your PCP is located in our provider database this encounter information will be shared with them immediately following your visit.   A [facility] MyChart account gives you access to today's visit and all your visits, tests, and labs performed at [facility]  click here if you don't have a [facility] MyChart account or go to [email or web address]  Consent: (Patient) Lamar JAYSON Reveal provided verbal consent for this virtual visit at the beginning of the encounter.  Current Medications:  Current Outpatient Medications:    BORON PO, Take by mouth daily., Disp: , Rfl:    cholecalciferol (VITAMIN D) 1000 UNITS tablet, Take 1,000 Units by mouth daily., Disp: , Rfl:    clomiPHENE  (CLOMID ) 50 MG tablet, Take 1 tablet (50 mg total) by mouth daily., Disp: 30 tablet, Rfl: 11   FINASTERIDE-MINOXIDIL EX, Apply topically daily., Disp: , Rfl:    lisdexamfetamine (VYVANSE ) 30 MG capsule, Take 1 capsule (30 mg total) by mouth daily., Disp: 90 capsule, Rfl: 0   MAGNESIUM PO, Take by mouth daily., Disp: , Rfl:    multivitamin (ONE-A-DAY MEN'S) TABS tablet, Take 1 tablet by mouth daily., Disp: , Rfl:    Omega-3 Fatty Acids (FISH OIL PO), Take 15 mLs by mouth daily., Disp: , Rfl:    Medications ordered in this encounter:  No orders of the defined types were placed in this encounter.    *If you need refills on other medications prior to your next appointment, please contact your pharmacy*  Follow-Up: Call back or seek an in-person evaluation if the symptoms worsen or if the condition fails to improve as anticipated.  [facility] [phone]  Other Instructions Please reach out to PCP office for acute appointment to check some things out giving your history. If unable to be evaluated this week, ok to start the  prednisone  on file at pharmacy. If you note any non-resolving, new, or worsening symptoms despite treatment, please seek an in-person evaluation ASAP.    If you have been instructed to have an in-person evaluation today at a local Urgent Care facility, please use the link below. It will take you to a list of all of our available [facility], including address, phone number and hours of operation. Please do not delay care.  [facility]  If you or a family member do not have a primary care provider, use the link below to schedule a visit and establish care. When you choose a [facility] primary care physician or advanced practice provider, you gain a long-term partner in health. Find a Primary Care Provider  Learn more about [facility]'s in-office and virtual care options: [facility] - Get Care Now

## 2024-03-19 NOTE — Progress Notes (Signed)
 Virtual Visit Consent   Antonio James, you are scheduled for a virtual visit with a [facility] provider today. Just as with appointments in the office, your consent must be obtained to participate. Your consent will be active for this visit and any virtual visit you may have with one of our providers in the next 365 days. If you have a MyChart account, a copy of this consent can be sent to you electronically.  As this is a virtual visit, video technology does not allow for your provider to perform a traditional examination. This may limit your provider's ability to fully assess your condition. If your provider identifies any concerns that need to be evaluated in person or the need to arrange testing (such as labs, EKG, etc.), we will make arrangements to do so. Although advances in technology are sophisticated, we cannot ensure that it will always work on either your end or our end. If the connection with a video visit is poor, the visit may have to be switched to a telephone visit. With either a video or telephone visit, we are not always able to ensure that we have a secure connection.  By engaging in this virtual visit, you consent to the provision of healthcare and authorize for your insurance to be billed (if applicable) for the services provided during this visit. Depending on your insurance coverage, you may receive a charge related to this service.  I need to obtain your verbal consent now. Are you willing to proceed with your visit today? Antonio James has provided verbal consent on 03/19/2024 for a virtual visit (video or telephone). Elsie Velma Lunger, [place]  Date: 03/19/2024 8:04 AM   Virtual Visit via Video Note   I, Elsie Velma Lunger, connected with  Antonio James  ([id number], 02/14/1986) on 03/19/24 at  7:45 AM EDT by a video-enabled telemedicine application and verified that I am speaking with the correct person using two identifiers.  Location: Patient: Virtual Visit  Location Patient: Home Provider: Virtual Visit Location Provider: Home Office   I discussed the limitations of evaluation and management by telemedicine and the availability of in person appointments. The patient expressed understanding and agreed to proceed.    History of Present Illness: Antonio James is a 38 y.o. who identifies as a male who was assigned male at birth, and is being seen today for joint pain and swelling. Patient notes last Saturday into Sunday with fatigue, nausea and anorexia with mild diarrhea and body aches. Notes since Tuesday GI issues have resolved. Still with mild fatigue and ongoing joint pain -- ankles, knees, shoulder, hands and hips. Denies myalgias. Notes mild swelling and stiffness in hands. Denies fever. Denies headache or rash. Hx of Graves disease in remission.    HPI: HPI  Problems:  Patient Active Problem List   Diagnosis Date Noted   Encounter for general adult medical examination with abnormal findings 12/16/2023   Elevated LFTs 04/05/2023   Dyslipidemia, goal LDL below 130 04/04/2023   Screening for prostate cancer 04/04/2023   Thrombocytopenia (HCC) 04/04/2023   Lymphocytosis 04/04/2023   Sensorineural hearing loss (SNHL) of both ears 08/24/2022   Tinnitus of both ears 08/24/2022   Graves disease 10/28/2020   Routine general medical examination at a health care facility 02/16/2015   Hypogonadism male 02/16/2015   Adult ADHD 07/15/2014   GERD (gastroesophageal reflux disease) 01/21/2014    Allergies:  Allergies  Allergen Reactions   Sulfa Antibiotics Hives and Rash  Medications:  Current Outpatient Medications:    predniSONE  (DELTASONE ) 10 MG tablet, Take 6 tablets (60 mg total) by mouth daily for 1 day, THEN 5 tablets (50 mg total) daily for 1 day, THEN 4 tablets (40 mg total) daily for 1 day, THEN 3 tablets (30 mg total) daily for 1 day, THEN 2 tablets (20 mg total) daily for 1 day, THEN 1 tablet (10 mg total) daily for 1 day., Disp: 21  tablet, Rfl: 0   BORON PO, Take by mouth daily., Disp: , Rfl:    cholecalciferol (VITAMIN D) 1000 UNITS tablet, Take 1,000 Units by mouth daily., Disp: , Rfl:    clomiPHENE  (CLOMID ) 50 MG tablet, Take 1 tablet (50 mg total) by mouth daily., Disp: 30 tablet, Rfl: 11   FINASTERIDE-MINOXIDIL EX, Apply topically daily., Disp: , Rfl:    lisdexamfetamine (VYVANSE ) 30 MG capsule, Take 1 capsule (30 mg total) by mouth daily., Disp: 90 capsule, Rfl: 0   MAGNESIUM PO, Take by mouth daily., Disp: , Rfl:    multivitamin (ONE-A-DAY MEN'S) TABS tablet, Take 1 tablet by mouth daily., Disp: , Rfl:    Omega-3 Fatty Acids (FISH OIL PO), Take 15 mLs by mouth daily., Disp: , Rfl:   Observations/Objective: Patient is well-developed, well-nourished in no acute distress.  Resting comfortably at home.  Head is normocephalic, atraumatic.  No labored breathing. Speech is clear and coherent with logical content.  Patient is alert and oriented at baseline.   Assessment and Plan: 1. Pain in joint, multiple sites (Primary) - predniSONE  (DELTASONE ) 10 MG tablet; Take 6 tablets (60 mg total) by mouth daily for 1 day, THEN 5 tablets (50 mg total) daily for 1 day, THEN 4 tablets (40 mg total) daily for 1 day, THEN 3 tablets (30 mg total) daily for 1 day, THEN 2 tablets (20 mg total) daily for 1 day, THEN 1 tablet (10 mg total) daily for 1 day.  Dispense: 21 tablet; Refill: 0  Needs further assessment. Could be related to recent viral infection itself but giving joint swelling and history of grave's concern for potential trigger of other autoimmune condition. He is to call PCP office for assessment and blood work today. If unable to be seen within next few days, I did place script for prednisone  on file to help with pain and swelling until he can get proper assessment. ER precautions reviewed.   Follow Up Instructions: I discussed the assessment and treatment plan with the patient. The patient was provided an opportunity to  ask questions and all were answered. The patient agreed with the plan and demonstrated an understanding of the instructions.  A copy of instructions were sent to the patient via MyChart unless otherwise noted below.   The patient was advised to call back or seek an in-person evaluation if the symptoms worsen or if the condition fails to improve as anticipated.    Elsie Velma Lunger, PA-C

## 2024-03-20 ENCOUNTER — Ambulatory Visit (INDEPENDENT_AMBULATORY_CARE_PROVIDER_SITE_OTHER): Admitting: Internal Medicine

## 2024-03-20 ENCOUNTER — Encounter: Payer: Self-pay | Admitting: Internal Medicine

## 2024-03-20 ENCOUNTER — Ambulatory Visit: Payer: Self-pay | Admitting: Internal Medicine

## 2024-03-20 VITALS — BP 114/64 | Temp 98.1°F | Ht 74.0 in | Wt 222.0 lb

## 2024-03-20 DIAGNOSIS — E0500 Thyrotoxicosis with diffuse goiter without thyrotoxic crisis or storm: Secondary | ICD-10-CM | POA: Diagnosis not present

## 2024-03-20 DIAGNOSIS — M130 Polyarthritis, unspecified: Secondary | ICD-10-CM | POA: Insufficient documentation

## 2024-03-20 LAB — CBC WITH DIFFERENTIAL/PLATELET
Basophils Absolute: 0 K/uL (ref 0.0–0.1)
Basophils Relative: 0.6 % (ref 0.0–3.0)
Eosinophils Absolute: 0 K/uL (ref 0.0–0.7)
Eosinophils Relative: 0.1 % (ref 0.0–5.0)
HCT: 44.9 % (ref 39.0–52.0)
Hemoglobin: 15.2 g/dL (ref 13.0–17.0)
Lymphocytes Relative: 25.2 % (ref 12.0–46.0)
Lymphs Abs: 1.1 K/uL (ref 0.7–4.0)
MCHC: 33.8 g/dL (ref 30.0–36.0)
MCV: 87.4 fl (ref 78.0–100.0)
Monocytes Absolute: 0.2 K/uL (ref 0.1–1.0)
Monocytes Relative: 5.3 % (ref 3.0–12.0)
Neutro Abs: 3 K/uL (ref 1.4–7.7)
Neutrophils Relative %: 68.8 % (ref 43.0–77.0)
Platelets: 218 K/uL (ref 150.0–400.0)
RBC: 5.14 Mil/uL (ref 4.22–5.81)
RDW: 13.8 % (ref 11.5–15.5)
WBC: 4.3 K/uL (ref 4.0–10.5)

## 2024-03-20 LAB — BASIC METABOLIC PANEL WITH GFR
BUN: 20 mg/dL (ref 6–23)
CO2: 27 meq/L (ref 19–32)
Calcium: 8.9 mg/dL (ref 8.4–10.5)
Chloride: 103 meq/L (ref 96–112)
Creatinine, Ser: 1.17 mg/dL (ref 0.40–1.50)
GFR: 78.95 mL/min (ref >60.00)
Glucose, Bld: 111 mg/dL — ABNORMAL HIGH (ref 70–99)
Potassium: 4.3 meq/L (ref 3.5–5.1)
Sodium: 137 meq/L (ref 135–145)

## 2024-03-20 LAB — URIC ACID: Uric Acid, Serum: 3.7 mg/dL — ABNORMAL LOW (ref 4.0–7.8)

## 2024-03-20 LAB — SEDIMENTATION RATE: Sed Rate: 8 mm/h (ref 0–15)

## 2024-03-20 LAB — HIGH SENSITIVITY CRP: CRP, High Sensitivity: 1.26 mg/L (ref 0.000–5.000)

## 2024-03-20 MED ORDER — METHYLPREDNISOLONE ACETATE 40 MG/ML IJ SUSP
40.0000 mg | Freq: Once | INTRAMUSCULAR | Status: AC
  Administered 2024-03-20: 40 mg via INTRAMUSCULAR

## 2024-03-20 NOTE — Patient Instructions (Signed)
 You had the steroid shot today  Please continue all other medications as before, including the prednisone  you have started  Please have the pharmacy call with any other refills you may need.  Please keep your appointments with your specialists as you may have planned  Please go to the LAB at the blood drawing area for the tests to be done  You will be contacted by phone if any changes need to be made immediately.  Otherwise, you will receive a letter about your results with an explanation, but please check with MyChart first.

## 2024-03-20 NOTE — Assessment & Plan Note (Signed)
 Pt also requests thyroid  panel with tsh - also with labs

## 2024-03-20 NOTE — Progress Notes (Signed)
 The test results show that your current treatment is OK, as the tests are stable.  Please continue the same plan.  There is no other need for change of treatment or further evaluation based on these results, at this time.  thanks

## 2024-03-20 NOTE — Progress Notes (Signed)
 Patient ID: Antonio James, male   DOB: Mar 22, 1986, 38 y.o.   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 male here with c/o recent possible viral illness c/w AGE resolved last wk, then developed pain and swelling to both hands and feet.  Seen at [facility] yesterday, tx with prednisone  to which he took first dose this AM.  Pain about 10/10.  No prior hx of significant arthritis and has been very active in athletics and wt lifting for several years.  Pt denies chest pain, increased sob or doe, wheezing, orthopnea, PND, increased LE swelling, palpitations, dizziness or syncope.   Pt denies polydipsia, polyuria, or new focal neuro s/s.    Wt Readings from Last 3 Encounters:  03/20/24 222 lb (100.7 kg)  12/16/23 224 lb 12.8 oz (102 kg)  04/12/23 222 lb (100.7 kg)   BP Readings from Last 3 Encounters:  03/20/24 114/64  12/16/23 132/86  04/12/23 110/70         Past Medical History:  Diagnosis Date   Adult ADHD    Depression    counseling throughout his life, no meds   GERD (gastroesophageal reflux disease)    Left hip pain    Intermittent, has known torn labrum in left hip   Panic attacks    Situational anxiety    Past Surgical History:  Procedure Laterality Date   WISDOM TOOTH EXTRACTION     No complications    reports that he has quit smoking. His smoking use included e-cigarettes. He has quit using smokeless tobacco. He reports current alcohol use. He reports that he does not use drugs. family history includes Cancer in his maternal grandmother and mother; Diabetes in his paternal grandmother; Heart attack in his paternal grandfather; Heart disease in his maternal grandfather; Other in his paternal grandfather; Stroke in his maternal grandfather. Allergies  Allergen Reactions   Sulfa Antibiotics Hives and Rash   Current Outpatient Medications on File Prior to Visit  Medication Sig Dispense Refill    BORON PO Take by mouth daily.     cholecalciferol (VITAMIN D) 1000 UNITS tablet Take 1,000 Units by mouth daily.     clomiPHENE  (CLOMID ) 50 MG tablet Take 1 tablet (50 mg total) by mouth daily. 30 tablet 11   FINASTERIDE-MINOXIDIL EX Apply topically daily.     lisdexamfetamine (VYVANSE ) 30 MG capsule Take 1 capsule (30 mg total) by mouth daily. 90 capsule 0   MAGNESIUM PO Take by mouth daily.     multivitamin (ONE-A-DAY MEN'S) TABS tablet Take 1 tablet by mouth daily.     Omega-3 Fatty Acids (FISH OIL PO) Take 15 mLs by mouth daily.     predniSONE  (DELTASONE ) 10 MG tablet Take 6 tablets (60 mg total) by mouth daily for 1 day, THEN 5 tablets (50 mg total) daily for 1 day, THEN 4 tablets (40 mg total) daily for 1 day, THEN 3 tablets (30 mg total) daily for 1 day, THEN 2 tablets (20 mg total) daily for 1 day, THEN 1 tablet (10 mg total) daily for 1 day. 21 tablet 0   No current facility-administered medications on file prior to visit.        ROS:  All others reviewed and negative.  Objective        PE:  BP 114/64   Temp 98.1 F (36.7 C) (Temporal)  Ht 6' 2 (1.88 m)   Wt 222 lb (100.7 kg)   BMI 28.50 kg/m                 Constitutional: Pt appears in NAD               HENT: Head: NCAT.                Right Ear: External ear normal.                 Left Ear: External ear normal.                Eyes: . Pupils are equal, round, and reactive to light. Conjunctivae and EOM are normal               Nose: without d/c or deformity               Neck: Neck supple. Gross normal ROM               Cardiovascular: Normal rate and regular rhythm.                 Pulmonary/Chest: Effort normal and breath sounds without rales or wheezing.                Abd:  Soft, NT, ND, + BS, no organomegaly               Neurological: Pt is alert. At baseline orientation, motor grossly intact               Skin: Skin is warm. No rashes, no other new lesions, LE edema - none except has diffuse 1+ swelling  erythema tenderness to both hands and feet worst at the bilateral MCPs               Psychiatric: Pt behavior is normal without agitation   Micro: none  Cardiac tracings I have personally interpreted today:  none  Pertinent Radiological findings (summarize): none   Lab Results  Component Value Date   WBC 4.3 03/20/2024   HGB 15.2 03/20/2024   HCT 44.9 03/20/2024   PLT 218.0 03/20/2024   GLUCOSE 111 (H) 03/20/2024   CHOL 177 04/04/2023   TRIG 134.0 04/04/2023   HDL 32.50 (L) 04/04/2023   LDLCALC 118 (H) 04/04/2023   ALT 34 06/20/2023   AST 29 06/20/2023   NA 137 03/20/2024   K 4.3 03/20/2024   CL 103 03/20/2024   CREATININE 1.17 03/20/2024   BUN 20 03/20/2024   CO2 27 03/20/2024   TSH 2.28 06/20/2023   PSA 0.40 04/04/2023   INR 1.1 (H) 04/05/2023   Assessment/Plan:  Antonio James is a 38 y.o. White or Caucasian [1] male with  has a past medical history of Adult ADHD, Depression, GERD (gastroesophageal reflux disease), Left hip pain, Panic attacks, and Situational anxiety.  Polyarthritis Pt with high suspicion for new RA vs reactive arthritis vs other - pt to continue prednisone , for depomedrol 80 mg IM boost today, and check Rheum labs and sed rate, crp; consdier rheum appt for abnormal labs or persistent s/s  Graves disease Pt also requests thyroid  panel with tsh - also with labs  Followup: Return if symptoms worsen or fail to improve.  Antonio Rush, MD 03/20/2024 8:38 PM [facility]

## 2024-03-20 NOTE — Assessment & Plan Note (Addendum)
 Pt with high suspicion for new RA vs reactive arthritis vs other - pt to continue prednisone , for depomedrol 80 mg IM boost today, and check Rheum labs and sed rate, crp; consdier rheum appt for abnormal labs or persistent s/s

## 2024-03-23 LAB — CYCLIC CITRUL PEPTIDE ANTIBODY, IGG: Cyclic Citrullin Peptide Ab: <16 U

## 2024-03-23 LAB — THYROID PANEL WITH TSH
Free Thyroxine Index: 2.9 (ref 1.4–3.8)
T3 Uptake: 31 % (ref 22–35)
T4, Total: 9.2 ug/dL (ref 4.9–10.5)
TSH: 0.49 m[IU]/L (ref 0.40–4.50)

## 2024-03-23 LAB — RHEUMATOID FACTOR: Rheumatoid fact SerPl-aCnc: <10 [IU]/mL (ref <14)

## 2024-03-26 ENCOUNTER — Other Ambulatory Visit (HOSPITAL_COMMUNITY): Payer: Self-pay

## 2024-03-26 ENCOUNTER — Other Ambulatory Visit: Payer: Self-pay | Admitting: Family

## 2024-03-26 DIAGNOSIS — F909 Attention-deficit hyperactivity disorder, unspecified type: Secondary | ICD-10-CM

## 2024-03-30 ENCOUNTER — Other Ambulatory Visit (HOSPITAL_COMMUNITY): Payer: Self-pay

## 2024-03-30 ENCOUNTER — Encounter: Payer: Self-pay | Admitting: Internal Medicine

## 2024-03-31 ENCOUNTER — Other Ambulatory Visit: Payer: Self-pay | Admitting: Internal Medicine

## 2024-03-31 ENCOUNTER — Other Ambulatory Visit (HOSPITAL_COMMUNITY): Payer: Self-pay

## 2024-03-31 DIAGNOSIS — F909 Attention-deficit hyperactivity disorder, unspecified type: Secondary | ICD-10-CM

## 2024-03-31 MED ORDER — LISDEXAMFETAMINE DIMESYLATE 30 MG PO CAPS
30.0000 mg | ORAL_CAPSULE | Freq: Every day | ORAL | 0 refills | Status: DC
  Filled 2024-03-31: qty 30, 30d supply, fill #0
  Filled 2024-05-10 – 2024-05-11 (×2): qty 30, 30d supply, fill #1

## 2024-04-21 ENCOUNTER — Ambulatory Visit: Admitting: Internal Medicine

## 2024-05-10 ENCOUNTER — Other Ambulatory Visit (HOSPITAL_COMMUNITY): Payer: Self-pay

## 2024-05-12 ENCOUNTER — Other Ambulatory Visit: Payer: Self-pay | Admitting: Internal Medicine

## 2024-05-12 ENCOUNTER — Other Ambulatory Visit (HOSPITAL_COMMUNITY): Payer: Self-pay

## 2024-05-12 DIAGNOSIS — F909 Attention-deficit hyperactivity disorder, unspecified type: Secondary | ICD-10-CM

## 2024-05-12 NOTE — Telephone Encounter (Signed)
 Refill request for Vyvanse  30mg  90cap 0refills; Last fill 03/31/24; Last OV 03/20/24 for Polyarthritis

## 2024-05-13 ENCOUNTER — Encounter: Payer: Self-pay | Admitting: Internal Medicine

## 2024-05-13 ENCOUNTER — Other Ambulatory Visit (HOSPITAL_COMMUNITY): Payer: Self-pay

## 2024-05-13 ENCOUNTER — Ambulatory Visit: Admitting: Internal Medicine

## 2024-05-13 VITALS — BP 120/84 | HR 76 | Temp 98.4°F | Ht 74.0 in | Wt 219.6 lb

## 2024-05-13 DIAGNOSIS — F909 Attention-deficit hyperactivity disorder, unspecified type: Secondary | ICD-10-CM | POA: Diagnosis not present

## 2024-05-13 DIAGNOSIS — J029 Acute pharyngitis, unspecified: Secondary | ICD-10-CM | POA: Insufficient documentation

## 2024-05-13 MED ORDER — LISDEXAMFETAMINE DIMESYLATE 30 MG PO CAPS
30.0000 mg | ORAL_CAPSULE | Freq: Every day | ORAL | 0 refills | Status: DC
  Filled 2024-05-13: qty 30, 30d supply, fill #0

## 2024-05-13 NOTE — Progress Notes (Signed)
 Subjective:  Patient ID: Antonio James, male    DOB: [birth year]  Age: 38 y.o. MRN: [id number]  CC: URI   HPI Antonio James presents for f/up --  Discussed the use of AI scribe software for clinical note transcription with the patient, who gave verbal consent to proceed.  History of Present Illness Antonio James is a 38 year old male who presents with cold symptoms.  He developed cold symptoms this morning, including a scratchy throat and nasal drainage. No cough, fever, or chills. His wife and children recently experienced similar symptoms and recovered within a few days.  A couple of months ago, he experienced joint aches, which he suspects were related to COVID-19. Symptoms included nausea followed by swelling and pain in his ankles, knees, and fingers, lasting about a week. Treatment with prednisone  and an injection resolved the symptoms. No rash, nausea, vomiting, or diarrhea during that episode.  He has a history of Graves' disease, with normal thyroid  blood work during the joint aches episode. He is currently taking Vyvanse , topical finasteride, minoxidil, and Clomid  (25 mg on Monday, Wednesday, and Friday).     Outpatient Medications Prior to Visit  Medication Sig Dispense Refill   BORON PO Take by mouth daily.     cholecalciferol (VITAMIN D) 1000 UNITS tablet Take 1,000 Units by mouth daily.     clomiPHENE  (CLOMID ) 50 MG tablet Take 1 tablet (50 mg total) by mouth daily. 30 tablet 11   FINASTERIDE-MINOXIDIL EX Apply topically daily.     MAGNESIUM PO Take by mouth daily.     multivitamin (ONE-A-DAY MEN'S) TABS tablet Take 1 tablet by mouth daily.     Omega-3 Fatty Acids (FISH OIL PO) Take 15 mLs by mouth daily.     lisdexamfetamine (VYVANSE ) 30 MG capsule Take 1 capsule (30 mg total) by mouth daily. 30 capsule 0   lisdexamfetamine (VYVANSE ) 30 MG capsule Take 1 capsule (30 mg total) by mouth daily. 90 capsule 0   No facility-administered medications prior to visit.     ROS Review of Systems  Constitutional:  Negative for appetite change, chills, diaphoresis, fatigue and fever.  HENT:  Positive for congestion and sore throat. Negative for ear pain, facial swelling, postnasal drip, rhinorrhea, sinus pressure, trouble swallowing and voice change.   Eyes: Negative.   Respiratory: Negative.  Negative for cough, chest tightness, shortness of breath and wheezing.   Cardiovascular:  Negative for chest pain, palpitations and leg swelling.  Gastrointestinal: Negative.  Negative for abdominal pain, constipation, diarrhea, nausea and vomiting.  Endocrine: Negative.   Genitourinary: Negative.  Negative for difficulty urinating.  Musculoskeletal: Negative.  Negative for back pain, joint swelling and myalgias.  Skin: Negative.   Neurological: Negative.  Negative for dizziness, weakness and headaches.  Hematological:  Negative for adenopathy. Does not bruise/bleed easily.  Psychiatric/Behavioral:  Positive for decreased concentration. Negative for confusion, dysphoric mood, self-injury and sleep disturbance. The patient is not nervous/anxious.     Objective:  BP 120/84 (BP Location: Left Arm, Patient Position: Sitting, Cuff Size: Normal)   Pulse 76   Temp 98.4 F (36.9 C) (Oral)   Ht 6' 2 (1.88 m)   Wt 219 lb 9.6 oz (99.6 kg)   SpO2 97%   BMI 28.19 kg/m   BP Readings from Last 3 Encounters:  05/13/24 120/84  03/20/24 114/64  12/16/23 132/86    Wt Readings from Last 3 Encounters:  05/13/24 219 lb 9.6 oz (99.6 kg)  03/20/24 222 lb (  100.7 kg)  12/16/23 224 lb 12.8 oz (102 kg)    Physical Exam Vitals reviewed.  Constitutional:      General: He is not in acute distress.    Appearance: He is not ill-appearing, toxic-appearing or diaphoretic.  HENT:     Mouth/Throat:     Mouth: Mucous membranes are moist.     Pharynx: Posterior oropharyngeal erythema present. No pharyngeal swelling, oropharyngeal exudate, uvula swelling or postnasal drip.      Tonsils: No tonsillar exudate or tonsillar abscesses. 0 on the right. 0 on the left.  Eyes:     General: No scleral icterus. Neck:     Thyroid : No thyroid  mass or thyromegaly.  Cardiovascular:     Rate and Rhythm: Normal rate and regular rhythm.     Heart sounds: No murmur heard.    No friction rub. No gallop.  Pulmonary:     Effort: Pulmonary effort is normal.     Breath sounds: No stridor. No wheezing, rhonchi or rales.  Abdominal:     General: Abdomen is flat.     Palpations: There is no mass.     Tenderness: There is no abdominal tenderness. There is no guarding.     Hernia: No hernia is present.  Musculoskeletal:        General: Normal range of motion.     Cervical back: Neck supple. No crepitus.     Right lower leg: No edema.     Left lower leg: No edema.  Lymphadenopathy:     Cervical: No cervical adenopathy.     Right cervical: No superficial, deep or posterior cervical adenopathy.    Left cervical: No superficial, deep or posterior cervical adenopathy.  Skin:    General: Skin is warm and dry.     Findings: No erythema or rash.  Neurological:     General: No focal deficit present.     Mental Status: He is alert and oriented to person, place, and time.  Psychiatric:        Mood and Affect: Mood normal.        Behavior: Behavior normal.     Lab Results  Component Value Date   WBC 4.3 03/20/2024   HGB 15.2 03/20/2024   HCT 44.9 03/20/2024   PLT 218.0 03/20/2024   GLUCOSE 111 (H) 03/20/2024   CHOL 177 04/04/2023   TRIG 134.0 04/04/2023   HDL 32.50 (L) 04/04/2023   LDLCALC 118 (H) 04/04/2023   ALT 34 06/20/2023   AST 29 06/20/2023   NA 137 03/20/2024   K 4.3 03/20/2024   CL 103 03/20/2024   CREATININE 1.17 03/20/2024   BUN 20 03/20/2024   CO2 27 03/20/2024   TSH 0.49 03/20/2024   PSA 0.40 04/04/2023   INR 1.1 (H) 04/05/2023    VAS US  UPPER EXTREMITY VENOUS DUPLEX Result Date: 02/17/2021 UPPER VENOUS STUDY  Patient Name:  Antonio James [facility]  Date of Exam:    02/16/2021 Medical Rec #: [id number]        Accession #:    [id number] Date of Birth: October 13, 1985       Patient Gender: M Patient Age:   67Y Exam Location:  [facility] Procedure:      VAS US  UPPER EXTREMITY VENOUS DUPLEX Referring Phys: [id number] STACY J BURNS --------------------------------------------------------------------------------  Other Indications: Right arm heaviness for the past nine months; feels like blood pooling. He denies any pain. Risk Factors: None identified. Performing Technologist: Tillman Nora RVT  Examination Guidelines: A complete evaluation includes  B-mode imaging, spectral Doppler, color Doppler, and power Doppler as needed of all accessible portions of each vessel. Bilateral testing is considered an integral part of a complete examination. Limited examinations for reoccurring indications may be performed as noted.  Right Findings: +----------+------------+---------+-----------+----------+-------+ RIGHT     CompressiblePhasicitySpontaneousPropertiesSummary +----------+------------+---------+-----------+----------+-------+ IJV           Full       Yes       Yes                      +----------+------------+---------+-----------+----------+-------+ Subclavian               Yes       Yes                      +----------+------------+---------+-----------+----------+-------+ Axillary      Full       Yes       Yes                      +----------+------------+---------+-----------+----------+-------+ Brachial      Full       Yes       Yes                      +----------+------------+---------+-----------+----------+-------+ Radial        Full       Yes       Yes                      +----------+------------+---------+-----------+----------+-------+ Ulnar         Full       Yes       Yes                      +----------+------------+---------+-----------+----------+-------+ Cephalic      Full       Yes       Yes                       +----------+------------+---------+-----------+----------+-------+ Basilic       Full       Yes       Yes                      +----------+------------+---------+-----------+----------+-------+  Left Findings: +----------+------------+---------+-----------+----------+-------+ LEFT      CompressiblePhasicitySpontaneousPropertiesSummary +----------+------------+---------+-----------+----------+-------+ IJV           Full       Yes       Yes                      +----------+------------+---------+-----------+----------+-------+ Subclavian               Yes       Yes                      +----------+------------+---------+-----------+----------+-------+  Summary:  Right: No evidence of deep vein thrombosis in the upper extremity. No evidence of superficial vein thrombosis in the upper extremity. No evidence of thrombosis in the subclavian.  Left: No evidence of thrombosis in the subclavian.  *See table(s) above for measurements and observations.  Diagnosing physician: Dorn Lesches MD Electronically signed by Dorn Lesches MD on 02/17/2021 at 11:26:46 AM.    Final     Assessment & Plan:   Adult ADHD -     Lisdexamfetamine Dimesylate ;  Take 1 capsule (30 mg total) by mouth daily.  Dispense: 30 capsule; Refill: 0  Acute viral pharyngitis- This is viral. He will test at home for COVID and strep.     Follow-up: No follow-ups on file.  Debby Molt, MD

## 2024-05-14 ENCOUNTER — Ambulatory Visit: Admitting: Internal Medicine

## 2024-05-15 MED ORDER — LISDEXAMFETAMINE DIMESYLATE 30 MG PO CAPS: 30.0000 mg | ORAL_CAPSULE | Freq: Every day | ORAL | 0 refills | Status: DC

## 2024-05-19 ENCOUNTER — Ambulatory Visit: Admitting: Internal Medicine

## 2024-06-15 ENCOUNTER — Encounter: Payer: Self-pay | Admitting: Internal Medicine

## 2024-06-16 ENCOUNTER — Other Ambulatory Visit (HOSPITAL_COMMUNITY): Payer: Self-pay

## 2024-06-16 ENCOUNTER — Other Ambulatory Visit: Payer: Self-pay

## 2024-06-16 DIAGNOSIS — F909 Attention-deficit hyperactivity disorder, unspecified type: Secondary | ICD-10-CM

## 2024-06-16 MED ORDER — LISDEXAMFETAMINE DIMESYLATE 30 MG PO CAPS
30.0000 mg | ORAL_CAPSULE | Freq: Every day | ORAL | 0 refills | Status: DC
  Filled 2024-06-16: qty 30, 30d supply, fill #0

## 2024-07-16 ENCOUNTER — Other Ambulatory Visit: Payer: Self-pay | Admitting: Internal Medicine

## 2024-07-16 DIAGNOSIS — F909 Attention-deficit hyperactivity disorder, unspecified type: Secondary | ICD-10-CM

## 2024-07-20 ENCOUNTER — Other Ambulatory Visit (HOSPITAL_COMMUNITY): Payer: Self-pay

## 2024-07-20 ENCOUNTER — Encounter: Payer: Self-pay | Admitting: Internal Medicine

## 2024-07-20 MED ORDER — LISDEXAMFETAMINE DIMESYLATE 30 MG PO CAPS
30.0000 mg | ORAL_CAPSULE | Freq: Every day | ORAL | 0 refills | Status: DC
  Filled 2024-07-20: qty 30, 30d supply, fill #0

## 2024-08-26 ENCOUNTER — Other Ambulatory Visit (HOSPITAL_COMMUNITY): Payer: Self-pay

## 2024-08-26 ENCOUNTER — Other Ambulatory Visit: Payer: Self-pay | Admitting: Internal Medicine

## 2024-08-26 DIAGNOSIS — F909 Attention-deficit hyperactivity disorder, unspecified type: Secondary | ICD-10-CM

## 2024-08-26 MED ORDER — LISDEXAMFETAMINE DIMESYLATE 30 MG PO CAPS
30.0000 mg | ORAL_CAPSULE | Freq: Every day | ORAL | 0 refills | Status: DC
  Filled 2024-08-26 – 2024-09-11 (×3): qty 30, 30d supply, fill #0

## 2024-09-07 ENCOUNTER — Other Ambulatory Visit (HOSPITAL_COMMUNITY): Payer: Self-pay

## 2024-09-08 ENCOUNTER — Telehealth (HOSPITAL_COMMUNITY): Payer: Self-pay | Admitting: Pharmacy Technician

## 2024-09-08 ENCOUNTER — Other Ambulatory Visit (HOSPITAL_COMMUNITY): Payer: Self-pay

## 2024-09-08 ENCOUNTER — Telehealth (HOSPITAL_COMMUNITY): Payer: Self-pay

## 2024-09-08 NOTE — Telephone Encounter (Signed)
 PA request has been Received. New Encounter has been or will be created for follow up. For additional info see Pharmacy Prior Auth telephone encounter from 09/08/24.

## 2024-09-08 NOTE — Telephone Encounter (Signed)
 Please advise

## 2024-09-08 NOTE — Telephone Encounter (Signed)
 Pharmacy Patient Advocate Encounter   Received notification from Pt Calls Messages that prior authorization for Lisdexamfetamine  30 mg capsules is required/requested.   Insurance verification completed.   The patient is insured through [employer].   Per test claim: Per test claim, medication is not covered due to plan/benefit exclusion, PA not submitted at this time    *Looks like the patient has changed insurance and her new plan doesn't cover this medication

## 2024-09-11 ENCOUNTER — Other Ambulatory Visit (HOSPITAL_COMMUNITY): Payer: Self-pay

## 2024-09-11 ENCOUNTER — Other Ambulatory Visit: Payer: Self-pay | Admitting: Internal Medicine

## 2024-09-11 DIAGNOSIS — F909 Attention-deficit hyperactivity disorder, unspecified type: Secondary | ICD-10-CM

## 2024-09-13 ENCOUNTER — Encounter: Payer: Self-pay | Admitting: Internal Medicine

## 2024-09-14 ENCOUNTER — Encounter (HOSPITAL_COMMUNITY): Payer: Self-pay

## 2024-09-14 ENCOUNTER — Other Ambulatory Visit (HOSPITAL_COMMUNITY): Payer: Self-pay

## 2024-09-14 ENCOUNTER — Other Ambulatory Visit: Payer: Self-pay | Admitting: Internal Medicine

## 2024-09-14 DIAGNOSIS — F909 Attention-deficit hyperactivity disorder, unspecified type: Secondary | ICD-10-CM

## 2024-09-14 MED ORDER — AMPHETAMINE-DEXTROAMPHET ER 30 MG PO CP24
30.0000 mg | ORAL_CAPSULE | ORAL | 0 refills | Status: AC
  Filled 2024-09-14: qty 30, 30d supply, fill #0

## 2024-09-24 ENCOUNTER — Other Ambulatory Visit (HOSPITAL_COMMUNITY): Payer: Self-pay

## 2024-10-09 ENCOUNTER — Other Ambulatory Visit (HOSPITAL_COMMUNITY): Payer: Self-pay
